# Patient Record
Sex: Female | Born: 1950
Health system: Southern US, Community
[De-identification: ages and names within clinical notes are randomized; demographics above are authoritative.]

## PROBLEM LIST (undated history)

## (undated) DIAGNOSIS — F32A Depression, unspecified: Secondary | ICD-10-CM

## (undated) DIAGNOSIS — E119 Type 2 diabetes mellitus without complications: Secondary | ICD-10-CM

## (undated) DIAGNOSIS — J189 Pneumonia, unspecified organism: Secondary | ICD-10-CM

## (undated) DIAGNOSIS — J45909 Unspecified asthma, uncomplicated: Secondary | ICD-10-CM

## (undated) DIAGNOSIS — K219 Gastro-esophageal reflux disease without esophagitis: Secondary | ICD-10-CM

## (undated) DIAGNOSIS — F329 Major depressive disorder, single episode, unspecified: Secondary | ICD-10-CM

## (undated) DIAGNOSIS — R270 Ataxia, unspecified: Secondary | ICD-10-CM

## (undated) DIAGNOSIS — D649 Anemia, unspecified: Secondary | ICD-10-CM

## (undated) DIAGNOSIS — M1990 Unspecified osteoarthritis, unspecified site: Secondary | ICD-10-CM

## (undated) DIAGNOSIS — C801 Malignant (primary) neoplasm, unspecified: Secondary | ICD-10-CM

## (undated) DIAGNOSIS — E559 Vitamin D deficiency, unspecified: Secondary | ICD-10-CM

## (undated) DIAGNOSIS — E785 Hyperlipidemia, unspecified: Secondary | ICD-10-CM

## (undated) HISTORY — PX: APPENDECTOMY: SHX54

## (undated) HISTORY — PX: ABDOMINAL HYSTERECTOMY: SHX81

## (undated) HISTORY — PX: COLONOSCOPY: SHX174

## (undated) HISTORY — PX: OTHER SURGICAL HISTORY: SHX169

## (undated) HISTORY — PX: TONSILLECTOMY: SUR1361

## (undated) HISTORY — PX: FOOT SURGERY: SHX648

## (undated) HISTORY — PX: UPPER GI ENDOSCOPY: SHX6162

## (undated) HISTORY — PX: MASTECTOMY: SHX3

---
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
## 1998-06-19 DIAGNOSIS — C801 Malignant (primary) neoplasm, unspecified: Secondary | ICD-10-CM

## 1998-06-19 HISTORY — DX: Malignant (primary) neoplasm, unspecified: C80.1

## 2018-03-06 ENCOUNTER — Other Ambulatory Visit: Payer: Self-pay | Admitting: Podiatry

## 2018-03-08 NOTE — Patient Instructions (Addendum)
Yvette Mcconnell  03/08/2018     @PREFPERIOPPHARMACY @   Your procedure is scheduled on  03/13/2018   Report to [facility] at  900   A.M.  Call this number if you have problems the morning of surgery:  [phone]   Remember:  Do not eat or drink after midnight.  You may drink clear liquids until  12 midnight 03/12/2018.  Clear liquids allowed are:                    Water, Juice (non-citric and without pulp), Carbonated beverages, Clear Tea, Black Coffee only, Plain Jell-O only, Gatorade and Plain Popsicles only    Take these medicines the morning of surgery with A SIP OF WATER  Mobic, prilosec. Decrease your Levemir dosage to 1/2 the amount the night before.    Do not wear jewelry, make-up or nail polish.  Do not wear lotions, powders, or perfumes, or deodorant.  Do not shave 48 hours prior to surgery.  Men may shave face and neck.  Do not bring valuables to the hospital.  [facility] is not responsible for any belongings or valuables.  Contacts, dentures or bridgework may not be worn into surgery.  Leave your suitcase in the car.  After surgery it may be brought to your room.  For patients admitted to the hospital, discharge time will be determined by your treatment team.  Patients discharged the day of surgery will not be allowed to drive home.   Name and phone number of your driver:   family Special instructions:  None  Please read over the following fact sheets that you were given. Anesthesia Post-op Instructions and Care and Recovery After Surgery       Metatarsal Osteotomy Metatarsal osteotomy is a surgical procedure to correct a toe dislocation or deformity. The surgery may also help to relieve foot pain. Your metatarsals are the five long bones that connect your toes to the rest of your foot. Osteotomy is a surgical cut into a bone to reshape and reposition the bone or joint. Tell a health care provider about:  Any allergies you have.  All  medicines you are taking, including vitamins, herbs, eye drops, creams, and over-the-counter medicines.  Any problems you or family members have had with anesthetic medicines.  Any blood disorders you have.  Any surgeries you have had.  Any medical conditions you have. What are the risks? Generally, this is a safe procedure. However, problems may occur, including:  Stiffness.  Pain.  Infection.  Bleeding.  Allergic reactions to medicines.  Nerve damage that causes numbness.  Failure of the osteotomy to heal.  A blood clot that forms in your leg and travels to your lungs (pulmonary embolism).  What happens before the procedure?  Your health care provider may order imaging tests of your foot.  Follow instructions from your health care provider about eating or drinking restrictions.  Ask your health care provider about: ? Changing or stopping your regular medicines. This is especially important if you are taking diabetes medicines or blood thinners. ? Taking medicines such as aspirin and ibuprofen. These medicines can thin your blood. Do not take these medicines before your procedure if your health care provider instructs you not to.  Plan to have someone take you home after the procedure.  Ask your health care provider how your surgical site will be marked or identified.  You may be given antibiotic  medicine to help prevent infection. What happens during the procedure?  To reduce your risk of infection: ? Your health care team will wash or sanitize their hands. ? Your skin will be washed with soap. ? Hair may be removed from the surgical area.  An IV tube will be started in one of your veins.  You will be given one or more of the following: ? A medicine to help you relax (sedative). ? A medicine to numb the area (local anesthetic). ? A medicine to make you fall asleep (general anesthetic). ? A medicine that is injected into your spine to numb the area below and  slightly above the injection site (spinal anesthetic). ? A medicine that is injected into an area of your body to numb everything below the injection site (regional anesthetic).  A surgical cut (incision) will be made on your foot over the metatarsal bone that will be treated.  A cut will be made in the bone to shorten or straighten the bone.  Metal pins, plates, or screws may be used to hold the bone in the right position.  The incision will be closed with stitches (sutures) or staples.  A bandage (dressing) will be placed around the front and bottom of your foot. The procedure may vary among health care providers and hospitals. What happens after the procedure?  Your blood pressure, heart rate, breathing rate, and blood oxygen level will be monitored often until the medicines you were given have worn off.  You may be given walking aids, such as: ? A custom-fitted hard-soled shoe that keeps weight on your heel. ? A walking boot. ? A splint. ? Crutches or a walker to help you walk without putting weight on your foot.  Do not drive for 24 hours if you received a sedative. This information is not intended to replace advice given to you by your health care provider. Make sure you discuss any questions you have with your health care provider. Document Released: 05/17/2015 Document Revised: 11/11/2015 Document Reviewed: 01/28/2015 Elsevier Interactive Patient Education  2018 [place].  Metatarsal Osteotomy, Care After Refer to this sheet in the next few weeks. These instructions provide you with information about caring for yourself after your procedure. Your health care provider may also give you more specific instructions. Your treatment has been planned according to current medical practices, but problems sometimes occur. Call your health care provider if you have any problems or questions after your procedure. What can I expect after the procedure? After the procedure, it is common  to have:  Soreness.  Pain.  Stiffness.  Swelling.  Follow these instructions at home: If you have a splint:  Wear the splint as told by your health care provider. Remove it only as told by your health care provider.  Loosen the splint if your toes tingle, become numb, or turn cold and blue.  Do not let your splint get wet if it is not waterproof.  Keep the splint clean. Bathing  Do not take baths, swim, or use a hot tub until your health care provider approves. Ask your health care provider if you can take showers. You may only be allowed to take sponge baths for bathing.  If your splint is not waterproof, cover it with a watertight plastic bag when you take a bath or a shower.  Keep the bandage (dressing) dry until your health care provider says it can be removed. Incision care  Follow instructions from your health care provider about  how to take care of your cut from surgery (incision). Make sure you: ? Wash your hands with soap and water before you change your bandage (dressing). If soap and water are not available, use hand sanitizer. ? Change your dressing as told by your health care provider. ? Leave stitches (sutures), skin glue, or adhesive strips in place. These skin closures may need to stay in place for 2 weeks or longer. If adhesive strip edges start to loosen and curl up, you may trim the loose edges. Do not remove adhesive strips completely unless your health care provider tells you to do that.  Check your incision area every day for signs of infection. Check for: ? More redness, swelling, or pain. ? More fluid or blood. ? Warmth. ? Pus or a bad smell. Managing pain, stiffness, and swelling   If directed, apply ice to the injured area. ? Put ice in a plastic bag. ? Place a towel between your skin and the bag. ? Leave the ice on for 20 minutes, 2-3 times a day.  Move your toes often to avoid stiffness and to lessen swelling.  Raise (elevate) the injured  area above the level of your heart while you are sitting or lying down. Driving  Do not drive or operate heavy machinery while taking prescription pain medicine.  Do not drive for 24 hours if you received a sedative.  Ask your health care provider when it is safe to drive if you have a dressing, splint, special shoe, or walking boot on your foot. General instructions  If you were given a splint, special shoe, or walking boot, wear it as told by your health care provider.  Return to your normal activities as told by your health care provider. Ask your health care provider what activities are safe for you.  Do not use the injured limb to support your body weight until your health care provider says that you can. Use crutches or a walker as told by your health care provider.  Do not use any tobacco products, such as cigarettes, chewing tobacco, and e-cigarettes. Tobacco can delay bone healing. If you need help quitting, ask your health care provider.  Take over-the-counter and prescription medicines only as told by your health care provider.  Keep all follow-up visits as told by your health care provider. This is important. Contact a health care provider if:  You have a fever.  Your dressing becomes wet, loose, or stained with blood or discharge.  You have pus or a bad smell coming from your incision or bandage.  Your foot becomes red, swollen, or tender.  You have pain or stiffness that does not get better or gets worse.  You have tingling or numbness in your foot that does not get better or gets worse. Get help right away if:  You develop a warm and tender swelling in your leg.  You have chest pain.  You have trouble breathing. This information is not intended to replace advice given to you by your health care provider. Make sure you discuss any questions you have with your health care provider. Document Released: 05/17/2015 Document Revised: 11/11/2015 Document Reviewed:  01/28/2015 Elsevier Interactive Patient Education  2018 [place] Anesthesia is a term that refers to techniques, procedures, and medicines that help a person stay safe and comfortable during a medical procedure. Monitored anesthesia care, or sedation, is one type of anesthesia. Your anesthesia specialist may recommend sedation if you will be having  a procedure that does not require you to be unconscious, such as:  Cataract surgery.  A dental procedure.  A biopsy.  A colonoscopy.  During the procedure, you may receive a medicine to help you relax (sedative). There are three levels of sedation:  Mild sedation. At this level, you may feel awake and relaxed. You will be able to follow directions.  Moderate sedation. At this level, you will be sleepy. You may not remember the procedure.  Deep sedation. At this level, you will be asleep. You will not remember the procedure.  The more medicine you are given, the deeper your level of sedation will be. Depending on how you respond to the procedure, the anesthesia specialist may change your level of sedation or the type of anesthesia to fit your needs. An anesthesia specialist will monitor you closely during the procedure. Let your health care provider know about:  Any allergies you have.  All medicines you are taking, including vitamins, herbs, eye drops, creams, and over-the-counter medicines.  Any use of steroids (by mouth or as a cream).  Any problems you or family members have had with sedatives and anesthetic medicines.  Any blood disorders you have.  Any surgeries you have had.  Any medical conditions you have, such as sleep apnea.  Whether you are pregnant or may be pregnant.  Any use of cigarettes, alcohol, or street drugs. What are the risks? Generally, this is a safe procedure. However, problems may occur, including:  Getting too much medicine (oversedation).  Nausea.  Allergic  reaction to medicines.  Trouble breathing. If this happens, a breathing tube may be used to help with breathing. It will be removed when you are awake and breathing on your own.  Heart trouble.  Lung trouble.  Before the procedure Staying hydrated Follow instructions from your health care provider about hydration, which may include:  Up to 2 hours before the procedure - you may continue to drink clear liquids, such as water, clear fruit juice, black coffee, and plain tea.  Eating and drinking restrictions Follow instructions from your health care provider about eating and drinking, which may include:  8 hours before the procedure - stop eating heavy meals or foods such as meat, fried foods, or fatty foods.  6 hours before the procedure - stop eating light meals or foods, such as toast or cereal.  6 hours before the procedure - stop drinking milk or drinks that contain milk.  2 hours before the procedure - stop drinking clear liquids.  Medicines Ask your health care provider about:  Changing or stopping your regular medicines. This is especially important if you are taking diabetes medicines or blood thinners.  Taking medicines such as aspirin and ibuprofen. These medicines can thin your blood. Do not take these medicines before your procedure if your health care provider instructs you not to.  Tests and exams  You will have a physical exam.  You may have blood tests done to show: ? How well your kidneys and liver are working. ? How well your blood can clot.  General instructions  Plan to have someone take you home from the hospital or clinic.  If you will be going home right after the procedure, plan to have someone with you for 24 hours.  What happens during the procedure?  Your blood pressure, heart rate, breathing, level of pain and overall condition will be monitored.  An IV tube will be inserted into one of your veins.  Your anesthesia  specialist will give you  medicines as needed to keep you comfortable during the procedure. This may mean changing the level of sedation.  The procedure will be performed. After the procedure  Your blood pressure, heart rate, breathing rate, and blood oxygen level will be monitored until the medicines you were given have worn off.  Do not drive for 24 hours if you received a sedative.  You may: ? Feel sleepy, clumsy, or nauseous. ? Feel forgetful about what happened after the procedure. ? Have a sore throat if you had a breathing tube during the procedure. ? Vomit. This information is not intended to replace advice given to you by your health care provider. Make sure you discuss any questions you have with your health care provider. Document Released: 03/01/2005 Document Revised: 11/12/2015 Document Reviewed: 09/26/2015 Elsevier Interactive Patient Education  2018 [place], Care After These instructions provide you with information about caring for yourself after your procedure. Your health care provider may also give you more specific instructions. Your treatment has been planned according to current medical practices, but problems sometimes occur. Call your health care provider if you have any problems or questions after your procedure. What can I expect after the procedure? After your procedure, it is common to:  Feel sleepy for several hours.  Feel clumsy and have poor balance for several hours.  Feel forgetful about what happened after the procedure.  Have poor judgment for several hours.  Feel nauseous or vomit.  Have a sore throat if you had a breathing tube during the procedure.  Follow these instructions at home: For at least 24 hours after the procedure:   Do not: ? Participate in activities in which you could fall or become injured. ? Drive. ? Use heavy machinery. ? Drink alcohol. ? Take sleeping pills or medicines that cause drowsiness. ? Make important  decisions or sign legal documents. ? Take care of children on your own.  Rest. Eating and drinking  Follow the diet that is recommended by your health care provider.  If you vomit, drink water, juice, or soup when you can drink without vomiting.  Make sure you have little or no nausea before eating solid foods. General instructions  Have a responsible adult stay with you until you are awake and alert.  Take over-the-counter and prescription medicines only as told by your health care provider.  If you smoke, do not smoke without supervision.  Keep all follow-up visits as told by your health care provider. This is important. Contact a health care provider if:  You keep feeling nauseous or you keep vomiting.  You feel light-headed.  You develop a rash.  You have a fever. Get help right away if:  You have trouble breathing. This information is not intended to replace advice given to you by your health care provider. Make sure you discuss any questions you have with your health care provider. Document Released: 09/26/2015 Document Revised: 01/26/2016 Document Reviewed: 09/26/2015 Elsevier Interactive Patient Education  [employer].

## 2018-03-11 ENCOUNTER — Other Ambulatory Visit (HOSPITAL_COMMUNITY): Payer: Self-pay | Admitting: Podiatry

## 2018-03-11 ENCOUNTER — Encounter (HOSPITAL_COMMUNITY): Payer: Self-pay

## 2018-03-11 ENCOUNTER — Other Ambulatory Visit: Payer: Self-pay

## 2018-03-11 ENCOUNTER — Ambulatory Visit (HOSPITAL_COMMUNITY)
Admission: RE | Admit: 2018-03-11 | Discharge: 2018-03-11 | Disposition: A | Discharge status: Home / Self Care | Payer: Medicare Other | Source: Ambulatory Visit | Attending: Podiatry | Admitting: Podiatry

## 2018-03-11 ENCOUNTER — Encounter (HOSPITAL_COMMUNITY)
Admission: RE | Admit: 2018-03-11 | Discharge: 2018-03-11 | Disposition: A | Discharge status: Home / Self Care | Payer: Medicare Other | Source: Ambulatory Visit | Attending: Podiatry | Admitting: Podiatry

## 2018-03-11 DIAGNOSIS — E119 Type 2 diabetes mellitus without complications: Secondary | ICD-10-CM | POA: Insufficient documentation

## 2018-03-11 DIAGNOSIS — K219 Gastro-esophageal reflux disease without esophagitis: Secondary | ICD-10-CM | POA: Insufficient documentation

## 2018-03-11 DIAGNOSIS — M869 Osteomyelitis, unspecified: Secondary | ICD-10-CM | POA: Diagnosis not present

## 2018-03-11 DIAGNOSIS — M19072 Primary osteoarthritis, left ankle and foot: Secondary | ICD-10-CM | POA: Insufficient documentation

## 2018-03-11 DIAGNOSIS — Z01818 Encounter for other preprocedural examination: Secondary | ICD-10-CM | POA: Insufficient documentation

## 2018-03-11 HISTORY — DX: Type 2 diabetes mellitus without complications: E11.9

## 2018-03-11 HISTORY — DX: Unspecified osteoarthritis, unspecified site: M19.90

## 2018-03-11 HISTORY — DX: Gastro-esophageal reflux disease without esophagitis: K21.9

## 2018-03-11 LAB — CBC WITH DIFFERENTIAL/PLATELET
BASOS PCT: 0 %
Basophils Absolute: 0 10*3/uL (ref 0.0–0.1)
Eosinophils Absolute: 0.4 10*3/uL (ref 0.0–0.7)
Eosinophils Relative: 5 %
HEMATOCRIT: 34.9 % — AB (ref 36.0–46.0)
HEMOGLOBIN: 11 g/dL — AB (ref 12.0–15.0)
Lymphocytes Relative: 23 %
Lymphs Abs: 1.8 10*3/uL (ref 0.7–4.0)
MCH: 26.3 pg (ref 26.0–34.0)
MCHC: 31.5 g/dL (ref 30.0–36.0)
MCV: 83.3 fL (ref 78.0–100.0)
MONOS PCT: 7 %
Monocytes Absolute: 0.6 10*3/uL (ref 0.1–1.0)
NEUTROS ABS: 5.1 10*3/uL (ref 1.7–7.7)
NEUTROS PCT: 64 %
Platelets: 430 10*3/uL — ABNORMAL HIGH (ref 150–400)
RBC: 4.19 MIL/uL (ref 3.87–5.11)
RDW: 15.3 % (ref 11.5–15.5)
WBC: 8 10*3/uL (ref 4.0–10.5)

## 2018-03-11 LAB — BASIC METABOLIC PANEL
ANION GAP: 12 (ref 5–15)
BUN: 30 mg/dL — ABNORMAL HIGH (ref 8–23)
CHLORIDE: 100 mmol/L (ref 98–111)
CO2: 24 mmol/L (ref 22–32)
CREATININE: 1.06 mg/dL — AB (ref 0.44–1.00)
Calcium: 9.1 mg/dL (ref 8.9–10.3)
GFR calc Af Amer: >60 mL/min (ref >60)
GFR calc non Af Amer: 53 mL/min — ABNORMAL LOW (ref >60)
Glucose, Bld: 284 mg/dL — ABNORMAL HIGH (ref 70–99)
POTASSIUM: 4.6 mmol/L (ref 3.5–5.1)
Sodium: 136 mmol/L (ref 135–145)

## 2018-03-11 LAB — GLUCOSE, CAPILLARY: GLUCOSE-CAPILLARY: 272 mg/dL — AB (ref 70–99)

## 2018-03-11 LAB — HEMOGLOBIN A1C
Hgb A1c MFr Bld: 8.2 % — ABNORMAL HIGH (ref 4.8–5.6)
MEAN PLASMA GLUCOSE: 188.64 mg/dL

## 2018-03-13 ENCOUNTER — Ambulatory Visit (HOSPITAL_COMMUNITY): Payer: Medicare Other | Admitting: Anesthesiology

## 2018-03-13 ENCOUNTER — Ambulatory Visit (HOSPITAL_COMMUNITY)
Admission: RE | Admit: 2018-03-13 | Discharge: 2018-03-13 | Disposition: A | Discharge status: Home / Self Care | Payer: Medicare Other | Source: Ambulatory Visit | Attending: Podiatry | Admitting: Podiatry

## 2018-03-13 ENCOUNTER — Encounter (HOSPITAL_COMMUNITY)
Admission: RE | Disposition: A | Discharge status: Home / Self Care | Payer: Self-pay | Source: Ambulatory Visit | Attending: Podiatry

## 2018-03-13 ENCOUNTER — Ambulatory Visit (HOSPITAL_COMMUNITY): Payer: Medicare Other

## 2018-03-13 ENCOUNTER — Encounter (HOSPITAL_COMMUNITY): Payer: Self-pay | Admitting: *Deleted

## 2018-03-13 DIAGNOSIS — E11621 Type 2 diabetes mellitus with foot ulcer: Secondary | ICD-10-CM | POA: Diagnosis not present

## 2018-03-13 DIAGNOSIS — Z9889 Other specified postprocedural states: Secondary | ICD-10-CM

## 2018-03-13 DIAGNOSIS — Z79899 Other long term (current) drug therapy: Secondary | ICD-10-CM | POA: Diagnosis not present

## 2018-03-13 DIAGNOSIS — Z794 Long term (current) use of insulin: Secondary | ICD-10-CM | POA: Insufficient documentation

## 2018-03-13 DIAGNOSIS — E1140 Type 2 diabetes mellitus with diabetic neuropathy, unspecified: Secondary | ICD-10-CM | POA: Insufficient documentation

## 2018-03-13 DIAGNOSIS — K219 Gastro-esophageal reflux disease without esophagitis: Secondary | ICD-10-CM | POA: Diagnosis not present

## 2018-03-13 DIAGNOSIS — L02612 Cutaneous abscess of left foot: Secondary | ICD-10-CM | POA: Insufficient documentation

## 2018-03-13 DIAGNOSIS — L97529 Non-pressure chronic ulcer of other part of left foot with unspecified severity: Secondary | ICD-10-CM | POA: Insufficient documentation

## 2018-03-13 HISTORY — PX: INCISION AND DRAINAGE: SHX5863

## 2018-03-13 HISTORY — PX: BONE BIOPSY: SHX375

## 2018-03-13 HISTORY — PX: SESAMOIDECTOMY: SHX6418

## 2018-03-13 LAB — GLUCOSE, CAPILLARY
GLUCOSE-CAPILLARY: 145 mg/dL — AB (ref 70–99)
Glucose-Capillary: 161 mg/dL — ABNORMAL HIGH (ref 70–99)

## 2018-03-13 SURGERY — INCISION AND DRAINAGE
Anesthesia: Monitor Anesthesia Care | Laterality: Left

## 2018-03-13 MED ORDER — MIDAZOLAM HCL 5 MG/5ML IJ SOLN
INTRAMUSCULAR | Status: DC | PRN
  Administered 2018-03-13: 2 mg via INTRAVENOUS

## 2018-03-13 MED ORDER — VANCOMYCIN HCL 1000 MG IV SOLR
1000.0000 mg | Freq: Once | INTRAVENOUS | Status: DC
  Administered 2018-03-13: 1000 mg via INTRAVENOUS
  Filled 2018-03-13: qty 1000

## 2018-03-13 MED ORDER — PROPOFOL 10 MG/ML IV BOLUS
INTRAVENOUS | Status: AC
  Filled 2018-03-13: qty 20

## 2018-03-13 MED ORDER — FENTANYL CITRATE (PF) 100 MCG/2ML IJ SOLN
INTRAMUSCULAR | Status: AC
  Filled 2018-03-13: qty 2

## 2018-03-13 MED ORDER — PROPOFOL 500 MG/50ML IV EMUL
INTRAVENOUS | Status: DC | PRN
  Administered 2018-03-13: 11:00:00 via INTRAVENOUS
  Administered 2018-03-13: 125 ug/kg/min via INTRAVENOUS

## 2018-03-13 MED ORDER — SODIUM CHLORIDE 0.9 % IV SOLN
INTRAVENOUS | Status: DC | PRN
  Administered 2018-03-13: 10:00:00 via INTRAVENOUS

## 2018-03-13 MED ORDER — PROMETHAZINE HCL 25 MG/ML IJ SOLN
6.2500 mg | INTRAMUSCULAR | Status: DC | PRN
  Administered 2018-03-13: 6.25 mg via INTRAVENOUS
  Filled 2018-03-13: qty 1

## 2018-03-13 MED ORDER — BUPIVACAINE HCL (PF) 0.5 % IJ SOLN
INTRAMUSCULAR | Status: AC
  Filled 2018-03-13: qty 30

## 2018-03-13 MED ORDER — PROPOFOL 10 MG/ML IV BOLUS
INTRAVENOUS | Status: DC | PRN
  Administered 2018-03-13: 20 mg via INTRAVENOUS
  Administered 2018-03-13: 60 mg via INTRAVENOUS

## 2018-03-13 MED ORDER — LIDOCAINE HCL (PF) 1 % IJ SOLN: INTRAMUSCULAR | Status: DC | PRN

## 2018-03-13 MED ORDER — FENTANYL CITRATE (PF) 250 MCG/5ML IJ SOLN
INTRAMUSCULAR | Status: DC | PRN
  Administered 2018-03-13 (×4): 25 ug via INTRAVENOUS

## 2018-03-13 MED ORDER — SODIUM CHLORIDE 0.9% FLUSH
INTRAVENOUS | Status: AC
  Filled 2018-03-13: qty 10

## 2018-03-13 MED ORDER — LIDOCAINE HCL 1 % IJ SOLN
INTRAMUSCULAR | Status: DC | PRN
  Administered 2018-03-13: 20 mL via INTRAMUSCULAR

## 2018-03-13 MED ORDER — MEPERIDINE HCL 50 MG/ML IJ SOLN: 6.2500 mg | INTRAMUSCULAR | Status: DC | PRN

## 2018-03-13 MED ORDER — LACTATED RINGERS IV SOLN
INTRAVENOUS | Status: DC
  Administered 2018-03-13: 13:00:00 via INTRAVENOUS

## 2018-03-13 MED ORDER — BUPIVACAINE HCL (PF) 0.5 % IJ SOLN
INTRAMUSCULAR | Status: DC | PRN
  Administered 2018-03-13: 10 mL

## 2018-03-13 MED ORDER — LACTATED RINGERS IV SOLN
INTRAVENOUS | Status: DC
  Administered 2018-03-13: 10:00:00 via INTRAVENOUS

## 2018-03-13 MED ORDER — LIDOCAINE HCL (PF) 1 % IJ SOLN
INTRAMUSCULAR | Status: AC
  Filled 2018-03-13: qty 30

## 2018-03-13 MED ORDER — VANCOMYCIN HCL IN DEXTROSE 1-5 GM/200ML-% IV SOLN: 1000.0000 mg | Freq: Once | INTRAVENOUS | Status: DC

## 2018-03-13 MED ORDER — MIDAZOLAM HCL 2 MG/2ML IJ SOLN
INTRAMUSCULAR | Status: AC
  Filled 2018-03-13: qty 2

## 2018-03-13 MED ORDER — SODIUM CHLORIDE 0.9 % IR SOLN
Status: DC | PRN
  Administered 2018-03-13: 1

## 2018-03-13 SURGICAL SUPPLY — 57 items
BANDAGE ACE 4 STERILE (GAUZE/BANDAGES/DRESSINGS) ×2 IMPLANT
BANDAGE CONFORM 2X5YD N/S (GAUZE/BANDAGES/DRESSINGS) ×2 IMPLANT
BANDAGE ELASTIC 4 LF NS (GAUZE/BANDAGES/DRESSINGS) ×2 IMPLANT
BANDAGE ESMARK 4X12 BL STRL LF (DISPOSABLE) IMPLANT
BENZOIN TINCTURE PRP APPL 2/3 (GAUZE/BANDAGES/DRESSINGS) ×2 IMPLANT
BLADE AVERAGE 25X9 (BLADE) IMPLANT
BLADE SURG 15 STRL LF DISP TIS (BLADE) ×2 IMPLANT
BLADE SURG 15 STRL SS (BLADE) ×2
BNDG CONFORM 2 STRL LF (GAUZE/BANDAGES/DRESSINGS) ×2 IMPLANT
BNDG ESMARK 4X12 BLUE STRL LF (DISPOSABLE)
BNDG GAUZE ELAST 4 BULKY (GAUZE/BANDAGES/DRESSINGS) ×2 IMPLANT
BOOT STEPPER DURA LG (SOFTGOODS) IMPLANT
BOOT STEPPER DURA MED (SOFTGOODS) IMPLANT
BOOT STEPPER DURA SM (SOFTGOODS) IMPLANT
BOOT STEPPER DURA XLG (SOFTGOODS) IMPLANT
CHLORAPREP W/TINT 26ML (MISCELLANEOUS) ×2 IMPLANT
CLOTH BEACON ORANGE TIMEOUT ST (SAFETY) ×2 IMPLANT
CLSR STERI-STRIP ANTIMIC 1/2X4 (GAUZE/BANDAGES/DRESSINGS) ×2 IMPLANT
COVER LIGHT HANDLE STERIS (MISCELLANEOUS) ×4 IMPLANT
CUFF TOURNIQUET SINGLE 18IN (TOURNIQUET CUFF) ×2 IMPLANT
DECANTER SPIKE VIAL GLASS SM (MISCELLANEOUS) ×4 IMPLANT
DRAPE OEC MINIVIEW 54X84 (DRAPES) IMPLANT
DRSG ADAPTIC 3X8 NADH LF (GAUZE/BANDAGES/DRESSINGS) ×2 IMPLANT
ELECT REM PT RETURN 9FT ADLT (ELECTROSURGICAL) ×2
ELECTRODE REM PT RTRN 9FT ADLT (ELECTROSURGICAL) ×1 IMPLANT
GAUZE PACKING IODOFORM 1/4X15 (GAUZE/BANDAGES/DRESSINGS) ×2 IMPLANT
GAUZE SPONGE 4X4 12PLY STRL (GAUZE/BANDAGES/DRESSINGS) ×2 IMPLANT
GLOVE BIO SURGEON STRL SZ7 (GLOVE) ×2 IMPLANT
GLOVE BIO SURGEON STRL SZ7.5 (GLOVE) ×2 IMPLANT
GLOVE BIOGEL PI IND STRL 7.0 (GLOVE) ×2 IMPLANT
GLOVE BIOGEL PI IND STRL 7.5 (GLOVE) ×1 IMPLANT
GLOVE BIOGEL PI INDICATOR 7.0 (GLOVE) ×2
GLOVE BIOGEL PI INDICATOR 7.5 (GLOVE) ×1
GLOVE ECLIPSE 7.0 STRL STRAW (GLOVE) ×2 IMPLANT
GOWN STRL REUS W/ TWL LRG LVL3 (GOWN DISPOSABLE) ×1 IMPLANT
GOWN STRL REUS W/TWL LRG LVL3 (GOWN DISPOSABLE) ×7 IMPLANT
KIT TURNOVER CYSTO (KITS) ×2 IMPLANT
KIT TURNOVER KIT A (KITS) ×2 IMPLANT
MANIFOLD NEPTUNE II (INSTRUMENTS) ×2 IMPLANT
MARKER SKIN DUAL TIP RULER LAB (MISCELLANEOUS) IMPLANT
NEEDLE HYPO 25X1 1.5 SAFETY (NEEDLE) ×6 IMPLANT
NS IRRIG 1000ML POUR BTL (IV SOLUTION) ×2 IMPLANT
PACK BASIC LIMB (CUSTOM PROCEDURE TRAY) ×2 IMPLANT
PAD ABD 5X9 TENDERSORB (GAUZE/BANDAGES/DRESSINGS) ×2 IMPLANT
PAD ARMBOARD 7.5X6 YLW CONV (MISCELLANEOUS) ×2 IMPLANT
RASP SM TEAR CROSS CUT (RASP) IMPLANT
SET BASIN LINEN APH (SET/KITS/TRAYS/PACK) ×2 IMPLANT
SPONGE GAUZE 4X4 16PLY NONSTR (GAUZE/BANDAGES/DRESSINGS) ×2 IMPLANT
SPONGE LAP 18X18 X RAY DECT (DISPOSABLE) ×2 IMPLANT
SUT ETHILON 3 0 FSL (SUTURE) ×2 IMPLANT
SUT MON AB 2-0 CT1 36 (SUTURE) IMPLANT
SUT PROLENE 4 0 PS 2 18 (SUTURE) IMPLANT
SUT VIC AB 2-0 CT2 27 (SUTURE) ×2 IMPLANT
SUT VIC AB 4-0 PS2 27 (SUTURE) IMPLANT
SUT VICRYL AB 3-0 FS1 BRD 27IN (SUTURE) ×2 IMPLANT
SYR 30ML LL (SYRINGE) ×2 IMPLANT
SYR CONTROL 10ML LL (SYRINGE) ×4 IMPLANT

## 2018-03-13 NOTE — H&P (Signed)
.   HISTORY AND PHYSICAL INTERVAL NOTE:  03/13/2018  9:46 AM  Yvette Mcconnell  has presented today for surgery, with the diagnosis of left foot infected ulcer, evaluate for osteomyelitis.  The various methods of treatment have been discussed with the patient.  No guarantees were given.  After consideration of risks, benefits and other options for treatment, the patient has consented to surgery.  I have reviewed the patients' chart and labs.    Patient Vitals for the past 24 hrs:  Temp Temp src Pulse Resp SpO2  03/13/18 0852 98.2 F (36.8 C) Oral 87 16 97 %    A history and physical examination was performed in my office.  The patient was reexamined.  There have been no changes to this history and physical examination.  Yvette Mcconnell, DPM

## 2018-03-13 NOTE — Brief Op Note (Signed)
03/13/2018  12:33 PM  PATIENT:  Yvette Mcconnell  67 y.o. female  PRE-OPERATIVE DIAGNOSIS:  left foot infected ulcer, evaluate for osteomyelitis  POST-OPERATIVE DIAGNOSIS:  left foot infected ulcer, evaluate for osteomyelitis  PROCEDURE:  Procedure(s): INCISION AND DRAINAGE LEFT FOOT ULCER (Left) TIBIAL AND FIBULAR SESAMOIDECTOMY LEFT FOOT (Left) BONE BIOPSY 1ST METATARSAL HEAD LEFT FOOT (Left)  SURGEON:  Surgeon(s) and Role:    * Posey Pronto, Layla Barter, DPM - Primary  PHYSICIAN ASSISTANT:   ASSISTANTS: none   ANESTHESIA:   local and MAC  EBL:  5 mL   BLOOD ADMINISTERED:none  DRAINS: none   LOCAL MEDICATIONS USED:  MARCAINE   , LIDOCAINE  and Amount: 20 ml  SPECIMEN:  Excision  DISPOSITION OF SPECIMEN:  PATHOLOGY  COUNTS:  YES  TOURNIQUET:   Total Tourniquet Time Documented: Calf (Left) - 108 minutes Total: Calf (Left) - 108 minutes   DICTATION: .Viviann Spare Dictation  PLAN OF CARE: Discharge to home after PACU  PATIENT DISPOSITION:  PACU - hemodynamically stable.   Delay start of Pharmacological VTE agent (>24hrs) due to surgical blood loss or risk of bleeding: no.

## 2018-03-13 NOTE — Discharge Instructions (Signed)
.These instructions will give you an idea of what to expect after surgery and how to manage issues that may arise before your first post op office visit.  Pain Management Pain is best managed by staying ahead of it. If pain gets out of control, it is difficult to get it back under control. Local anesthesia that lasts 6-8 hours is used to numb the foot and decrease pain.  For the best pain control, take the pain medication every 4 hours for the first 2 days post op. On the third day pain medication can be taken as needed.   Post Op Nausea Nausea is common after surgery, so it is managed proactively.  If prescribed, use the prescribed nausea medication regularly for the first 2 days post op.  Bandages Do not worry if there is blood on the bandage. What looks like a lot of blood on the bandage is actually a small amount. Blood on the dressing spreads out as it is absorbed by the gauze, the same way a drop of water spreads out on a paper towel.  If the bandages feel wet or dry, stiff and uncomfortable, call the office during office hours and we will schedule a time for you to have the bandage changed.  Unless you are specifically told otherwise, we will do the first bandage change in the office.  Keep your bandage dry. If the bandage becomes wet or soiled, notify the office and we will schedule a time to change the bandage.  Activity It is best to spend most of the first 2 days after surgery lying down with the foot elevated above the level of your heart. You may put weight on your heel while wearing the surgical shoe.   You may only get up to go to the restroom.  Driving Do not drive until you are able to respond in an emergency (i.e. slam on the brakes). This usually occurs after the bone has healed - 6 to 8 weeks.  Call the Office If you have a fever over 101F.  If you have increasing pain after the initial post op pain has settled down.  If you have increasing redness, swelling, or  drainage.  If you have any questions or concerns.   General Anesthesia, Adult, Care After These instructions provide you with information about caring for yourself after your procedure. Your health care provider may also give you more specific instructions. Your treatment has been planned according to current medical practices, but problems sometimes occur. Call your health care provider if you have any problems or questions after your procedure. What can I expect after the procedure? After the procedure, it is common to have:  Vomiting.  A sore throat.  Mental slowness.  It is common to feel:  Nauseous.  Cold or shivery.  Sleepy.  Tired.  Sore or achy, even in parts of your body where you did not have surgery.  Follow these instructions at home: For at least 24 hours after the procedure:  Do not: ? Participate in activities where you could fall or become injured. ? Drive. ? Use heavy machinery. ? Drink alcohol. ? Take sleeping pills or medicines that cause drowsiness. ? Make important decisions or sign legal documents. ? Take care of children on your own.  Rest. Eating and drinking  If you vomit, drink water, juice, or soup when you can drink without vomiting.  Drink enough fluid to keep your urine clear or pale yellow.  Make sure you have little  or no nausea before eating solid foods.  Follow the diet recommended by your health care provider. General instructions  Have a responsible adult stay with you until you are awake and alert.  Return to your normal activities as told by your health care provider. Ask your health care provider what activities are safe for you.  Take over-the-counter and prescription medicines only as told by your health care provider.  If you smoke, do not smoke without supervision.  Keep all follow-up visits as told by your health care provider. This is important. Contact a health care provider if:  You continue to have nausea or  vomiting at home, and medicines are not helpful.  You cannot drink fluids or start eating again.  You cannot urinate after 8-12 hours.  You develop a skin rash.  You have fever.  You have increasing redness at the site of your procedure. Get help right away if:  You have difficulty breathing.  You have chest pain.  You have unexpected bleeding.  You feel that you are having a life-threatening or urgent problem. This information is not intended to replace advice given to you by your health care provider. Make sure you discuss any questions you have with your health care provider. Document Released: 09/11/2000 Document Revised: 11/08/2015 Document Reviewed: 05/20/2015 Elsevier Interactive Patient Education  [employer].

## 2018-03-13 NOTE — Anesthesia Postprocedure Evaluation (Signed)
Anesthesia Post Note  Patient: Yvette Mcconnell  Procedure(s) Performed: INCISION AND DRAINAGE LEFT FOOT ULCER (Left ) TIBIAL AND FIBULAR SESAMOIDECTOMY LEFT FOOT (Left ) BONE BIOPSY 1ST METATARSAL HEAD LEFT FOOT (Left )  Patient location during evaluation: PACU Anesthesia Type: MAC Level of consciousness: awake and alert and patient cooperative Pain management: pain level controlled Vital Signs Assessment: post-procedure vital signs reviewed and stable Respiratory status: spontaneous breathing, nonlabored ventilation and respiratory function stable Cardiovascular status: blood pressure returned to baseline Postop Assessment: no apparent nausea or vomiting Anesthetic complications: no     Last Vitals:  Vitals:   03/13/18 0852 03/13/18 1235  Pulse: 87   Resp: 16   Temp: 36.8 C 37 C  SpO2: 97% 100%    Last Pain:  Vitals:   03/13/18 0852  TempSrc: Oral  PainSc: 0-No pain                 Mackensie Pilson J

## 2018-03-13 NOTE — Anesthesia Preprocedure Evaluation (Signed)
Anesthesia Evaluation  Patient identified by MRN, date of birth, ID band Patient awake    Reviewed: Allergy & Precautions, H&P , NPO status , Patient's Chart, lab work & pertinent test results, reviewed documented beta blocker date and time   Airway Mallampati: II  TM Distance: >3 FB Neck ROM: full    Dental no notable dental hx. (+) Teeth Intact, Dental Advidsory Given   Pulmonary neg pulmonary ROS,    Pulmonary exam normal breath sounds clear to auscultation       Cardiovascular Exercise Tolerance: Good negative cardio ROS   Rhythm:regular Rate:Normal     Neuro/Psych negative neurological ROS  negative psych ROS   GI/Hepatic negative GI ROS, Neg liver ROS, GERD  ,  Endo/Other  negative endocrine ROSdiabetes, Type 2  Renal/GU negative Renal ROS  negative genitourinary   Musculoskeletal   Abdominal   Peds  Hematology negative hematology ROS (+)   Anesthesia Other Findings NSR 85 Denies any cardiopulmonary hx  Reproductive/Obstetrics negative OB ROS                             Anesthesia Physical Anesthesia Plan  ASA: III  Anesthesia Plan: MAC   Post-op Pain Management:    Induction:   PONV Risk Score and Plan:   Airway Management Planned:   Additional Equipment:   Intra-op Plan:   Post-operative Plan:   Informed Consent: I have reviewed the patients History and Physical, chart, labs and discussed the procedure including the risks, benefits and alternatives for the proposed anesthesia with the patient or authorized representative who has indicated his/her understanding and acceptance.   Dental Advisory Given  Plan Discussed with: CRNA and Anesthesiologist  Anesthesia Plan Comments:         Anesthesia Quick Evaluation

## 2018-03-13 NOTE — Op Note (Signed)
03/13/2018  12:33 PM  PATIENT:  Yvette Mcconnell  67 y.o. female  PRE-OPERATIVE DIAGNOSIS:  left foot infected ulcer, evaluate for osteomyelitis  POST-OPERATIVE DIAGNOSIS:  left foot infected ulcer, evaluate for osteomyelitis  PROCEDURE:  Procedure(s): INCISION AND DRAINAGE LEFT FOOT ULCER (Left) TIBIAL AND FIBULAR SESAMOIDECTOMY LEFT FOOT (Left) BONE BIOPSY 1ST METATARSAL HEAD LEFT FOOT (Left)  SURGEON:  Surgeon(s) and Role:    * Roshanna Cimino, Layla Barter, DPM - Primary  PHYSICIAN ASSISTANT:   ASSISTANTS: none   ANESTHESIA:   local and MAC  EBL:  5 mL   BLOOD ADMINISTERED:none  DRAINS: none   LOCAL MEDICATIONS USED:  MARCAINE   , LIDOCAINE  and Amount: 20 ml  SPECIMEN:  Excision  DISPOSITION OF SPECIMEN:  PATHOLOGY  COUNTS:  YES  TOURNIQUET:   Total Tourniquet Time Documented: Calf (Left) - 108 minutes Total: Calf (Left) - 108 minutes  Patient was brought into the operating room laid supine on the operating table. Ankle tourniquet was applied to the surgical extremity. Following IV sedation, a local block was achieved using 20 cc of mixture of 1% plain lidocaine with 0.5% marcaine. The foot was the prepped, scrubbed and draped in aseptic manner. The left lower extremity was held up for 3 minute and the tourniquet on the surgical site was inflatted at 272mHG.   Attention was directed towards the left first metatarsal. There is plantar ulceration noted at sub 1st met head with 0.2x0.2x0.5. There is serosanguinous drainage noted. Localized redness noted with soft tissue swelling and tenderness.    Medial plantar incision was planned to remove the tibial and fibular sesamoids. After the skin incision, neurovascular bundle was protected. There was lot of scar tissue noted from previous surgery and thickening of the capsule. Capsular incision was made and tibial sesamoid was visualized. After careful dissection the tibial sesamoid was removed. The flexor hallucis longus was intact.  Care was made to retract the tendon. At this time the fibular sesamoid was visualized and isolated. Using sharp and blunt dissection the fibular sesamoid was removed. It was broke into fragments, but all the bony fragments were removed. Fluoroscopy was used to confirm. At this the dorsal capsule was reflacted and using bone rongeur few piece of bone from 1st metatarsal head was removed for pathology. It was noted that range of motion of the 1st MPJ was increased slightly after clearing the fragments from the metatarsal head. Wound was flushed with copious amount of saline.   At this the attention was directed towards the plantar ulceration. From the ulcer site, a stab incision was made to extend the ulcer atleast 1cm. Using a straight hemostat, tissue dissection was performed. No purulent drainage noted. Deep wound cultures were obtained. Wound was flushed  with normal saline.   At this time the the capsule was closed using 2-0 Vicryl, sub cutaneous layer using 3-0 Vicryl and skin was closed using 3-0 Nylon. 1/2 inch Iodoform packing was used to pack the ulcer site on the plantar aspect. Steri strips and betadine adaptic was applied. Dry sterile dressing was applied.   Bone pieces will be sent to pathology for evaluation. Patient will be in surgical shoe and using knee scooter.   Patient has post op appointment with me in 2 days.

## 2018-03-13 NOTE — Transfer of Care (Signed)
Immediate Anesthesia Transfer of Care Note  Patient: Noora Locascio Folta  Procedure(s) Performed: INCISION AND DRAINAGE LEFT FOOT ULCER (Left ) TIBIAL AND FIBULAR SESAMOIDECTOMY LEFT FOOT (Left ) BONE BIOPSY 1ST METATARSAL HEAD LEFT FOOT (Left )  Patient Location: PACU  Anesthesia Type:General  Level of Consciousness: awake and patient cooperative  Airway & Oxygen Therapy: Patient Spontanous Breathing and Patient connected to face mask oxygen  Post-op Assessment: Report given to RN, Post -op Vital signs reviewed and stable and Patient moving all extremities  Post vital signs: Reviewed and stable  Last Vitals:  Vitals Value Taken Time  BP    Temp    Pulse    Resp    SpO2      Last Pain:  Vitals:   03/13/18 0852  TempSrc: Oral  PainSc: 0-No pain      Patients Stated Pain Goal: 5 (22/97/98 9211)  Complications: No apparent anesthesia complications

## 2018-03-13 NOTE — Anesthesia Procedure Notes (Signed)
Procedure Name: LMA Insertion Date/Time: 03/13/2018 10:36 AM Performed by: Charmaine Downs, CRNA Pre-anesthesia Checklist: Emergency Drugs available, Patient identified, Suction available and Patient being monitored Patient Re-evaluated:Patient Re-evaluated prior to induction Oxygen Delivery Method: Circle system utilized Preoxygenation: Pre-oxygenation with 100% oxygen Ventilation: Mask ventilation without difficulty LMA: LMA inserted LMA Size: 3.0 Grade View: Grade II Number of attempts: 1 Placement Confirmation: positive ETCO2 and CO2 detector Tube secured with: Tape Dental Injury: Teeth and Oropharynx as per pre-operative assessment

## 2018-03-14 ENCOUNTER — Encounter (HOSPITAL_COMMUNITY): Payer: Self-pay | Admitting: Podiatry

## 2018-03-18 NOTE — Progress Notes (Signed)
Late entry. 03-18-2018, 10:54 am. Call to patient. A black and white polka dot glasses case found in a wheelchair. Patient notified. Patient request to "throw it away, I don't need it and I don't live there".  Verified with Delphia Grates RN via telephone with patient verbalizing to Delphia Grates RN that she "doesn't want the glass case".

## 2018-03-19 LAB — AEROBIC/ANAEROBIC CULTURE W GRAM STAIN (SURGICAL/DEEP WOUND)

## 2018-03-19 LAB — AEROBIC/ANAEROBIC CULTURE (SURGICAL/DEEP WOUND)

## 2019-04-07 ENCOUNTER — Encounter (HOSPITAL_COMMUNITY): Payer: Self-pay | Admitting: *Deleted

## 2019-04-07 ENCOUNTER — Other Ambulatory Visit (HOSPITAL_COMMUNITY)
Admission: RE | Admit: 2019-04-07 | Discharge: 2019-04-07 | Disposition: A | Discharge status: Home / Self Care | Payer: Medicare PPO | Source: Ambulatory Visit | Attending: Orthopedic Surgery | Admitting: Orthopedic Surgery

## 2019-04-07 ENCOUNTER — Other Ambulatory Visit: Payer: Self-pay

## 2019-04-07 DIAGNOSIS — Z01812 Encounter for preprocedural laboratory examination: Secondary | ICD-10-CM | POA: Diagnosis present

## 2019-04-07 DIAGNOSIS — Z20828 Contact with and (suspected) exposure to other viral communicable diseases: Secondary | ICD-10-CM | POA: Insufficient documentation

## 2019-04-07 LAB — SARS CORONAVIRUS 2 (TAT 6-24 HRS): SARS Coronavirus 2: NEGATIVE

## 2019-04-07 NOTE — Anesthesia Preprocedure Evaluation (Signed)
Anesthesia Evaluation  Patient identified by MRN, date of birth, ID band Patient awake    Reviewed: Allergy & Precautions, H&P , NPO status , Patient's Chart, lab work & pertinent test results, reviewed documented beta blocker date and time   Airway Mallampati: II  TM Distance: >3 FB Neck ROM: full    Dental no notable dental hx. (+) Teeth Intact, Dental Advidsory Given   Pulmonary asthma , pneumonia,    Pulmonary exam normal breath sounds clear to auscultation       Cardiovascular Exercise Tolerance: Good negative cardio ROS   Rhythm:regular Rate:Normal     Neuro/Psych PSYCHIATRIC DISORDERS Depression negative neurological ROS     GI/Hepatic negative GI ROS, Neg liver ROS, GERD  ,  Endo/Other  negative endocrine ROSdiabetes, Type 2  Renal/GU negative Renal ROS  negative genitourinary   Musculoskeletal  (+) Arthritis ,   Abdominal   Peds  Hematology negative hematology ROS (+) anemia ,   Anesthesia Other Findings NSR 85 Denies any cardiopulmonary hx  Reproductive/Obstetrics negative OB ROS                             Anesthesia Physical  Anesthesia Plan  ASA: III  Anesthesia Plan: General   Post-op Pain Management: GA combined w/ Regional for post-op pain   Induction: Intravenous  PONV Risk Score and Plan: 3 and Ondansetron, Dexamethasone and Treatment may vary due to age or medical condition  Airway Management Planned: Oral ETT  Additional Equipment:   Intra-op Plan:   Post-operative Plan: Extubation in OR  Informed Consent: I have reviewed the patients History and Physical, chart, labs and discussed the procedure including the risks, benefits and alternatives for the proposed anesthesia with the patient or authorized representative who has indicated his/her understanding and acceptance.     Dental advisory given  Plan Discussed with: CRNA  Anesthesia Plan  Comments:         Anesthesia Quick Evaluation

## 2019-04-07 NOTE — Progress Notes (Signed)
Patient denies shortness of breath, fever, cough and chest pain.  PCP - Dr Michell Heinrich [facility] Cardiologist - Denies  Chest x-ray - DOS 04/08/19 EKG - DOS 04/08/19 Stress Test - 12/30/02 CE ECHO - 08/11/98 CE Cardiac Cath - Denies    Fasting Blood Sugar - 90-140s Checks Blood Sugar  2_ times a day  . Do not take oral diabetes medicines (Metformin, Januvia) the morning of surgery.  . THE NIGHT BEFORE SURGERY, take 50% of your dose of Levemir Insulin   (15 units)     . THE MORNING OF SURGERY, None unless your CBG is greater than 220 mg/dL, you may take  of your sliding scale (correction) dose of Levemir Insulin.  . If your blood sugar is less than 70 mg/dL, you will need to treat for low blood sugar: o Treat a low blood sugar (less than 70 mg/dL) with  cup of clear juice (cranberry or apple). o Recheck blood sugar in 15 minutes after treatment (to make sure it is greater than 70 mg/dL). If your blood sugar is not greater than 70 mg/dL on recheck, call [phone] for further instructions.  Anesthesia review: No  STOP now taking any Aspirin (unless otherwise instructed by your surgeon), Aleve, Naproxen, Ibuprofen, Motrin, Advil, Goody's, BC's, all herbal medications, fish oil, and all vitamins.   Coronavirus Screening Have you or experienced the following symptoms:  Cough yes/no: No Fever (>100.55F)  yes/no: No Runny nose yes/no: No Sore throat yes/no: No Difficulty breathing/shortness of breath  yes/no: No  Have you or  traveled in the last 14 days and where? yes/no: No   Covid test 04/07/19 pending.  Patient verbalized understanding of instructions that were given to her via phone.

## 2019-04-08 ENCOUNTER — Ambulatory Visit (HOSPITAL_COMMUNITY): Payer: Worker's Compensation

## 2019-04-08 ENCOUNTER — Observation Stay (HOSPITAL_COMMUNITY)
Admission: RE | Admit: 2019-04-08 | Discharge: 2019-04-09 | Disposition: A | Discharge status: Home / Self Care | Payer: Worker's Compensation | Attending: Orthopedic Surgery | Admitting: Orthopedic Surgery

## 2019-04-08 ENCOUNTER — Other Ambulatory Visit: Payer: Self-pay

## 2019-04-08 ENCOUNTER — Encounter (HOSPITAL_COMMUNITY)
Admission: RE | Disposition: A | Discharge status: Home / Self Care | Payer: Self-pay | Source: Home / Self Care | Attending: Orthopedic Surgery

## 2019-04-08 ENCOUNTER — Ambulatory Visit (HOSPITAL_COMMUNITY): Payer: Worker's Compensation | Admitting: Anesthesiology

## 2019-04-08 ENCOUNTER — Encounter (HOSPITAL_COMMUNITY): Payer: Self-pay

## 2019-04-08 ENCOUNTER — Observation Stay (HOSPITAL_COMMUNITY): Payer: Worker's Compensation

## 2019-04-08 DIAGNOSIS — E119 Type 2 diabetes mellitus without complications: Secondary | ICD-10-CM | POA: Insufficient documentation

## 2019-04-08 DIAGNOSIS — S42301K Unspecified fracture of shaft of humerus, right arm, subsequent encounter for fracture with nonunion: Principal | ICD-10-CM | POA: Insufficient documentation

## 2019-04-08 DIAGNOSIS — K219 Gastro-esophageal reflux disease without esophagitis: Secondary | ICD-10-CM | POA: Insufficient documentation

## 2019-04-08 DIAGNOSIS — E785 Hyperlipidemia, unspecified: Secondary | ICD-10-CM | POA: Diagnosis present

## 2019-04-08 DIAGNOSIS — D649 Anemia, unspecified: Secondary | ICD-10-CM | POA: Diagnosis not present

## 2019-04-08 DIAGNOSIS — Z9012 Acquired absence of left breast and nipple: Secondary | ICD-10-CM | POA: Diagnosis not present

## 2019-04-08 DIAGNOSIS — C801 Malignant (primary) neoplasm, unspecified: Secondary | ICD-10-CM | POA: Diagnosis present

## 2019-04-08 DIAGNOSIS — Z79899 Other long term (current) drug therapy: Secondary | ICD-10-CM | POA: Insufficient documentation

## 2019-04-08 DIAGNOSIS — Z853 Personal history of malignant neoplasm of breast: Secondary | ICD-10-CM | POA: Insufficient documentation

## 2019-04-08 DIAGNOSIS — E559 Vitamin D deficiency, unspecified: Secondary | ICD-10-CM | POA: Diagnosis not present

## 2019-04-08 DIAGNOSIS — Z794 Long term (current) use of insulin: Secondary | ICD-10-CM | POA: Diagnosis not present

## 2019-04-08 DIAGNOSIS — Z01818 Encounter for other preprocedural examination: Secondary | ICD-10-CM

## 2019-04-08 DIAGNOSIS — X58XXXD Exposure to other specified factors, subsequent encounter: Secondary | ICD-10-CM | POA: Diagnosis not present

## 2019-04-08 DIAGNOSIS — T148XXA Other injury of unspecified body region, initial encounter: Secondary | ICD-10-CM

## 2019-04-08 DIAGNOSIS — F329 Major depressive disorder, single episode, unspecified: Secondary | ICD-10-CM | POA: Diagnosis not present

## 2019-04-08 HISTORY — DX: Pneumonia, unspecified organism: J18.9

## 2019-04-08 HISTORY — DX: Anemia, unspecified: D64.9

## 2019-04-08 HISTORY — DX: Depression, unspecified: F32.A

## 2019-04-08 HISTORY — DX: Malignant (primary) neoplasm, unspecified: C80.1

## 2019-04-08 HISTORY — PX: ORIF HUMERUS FRACTURE: SHX2126

## 2019-04-08 HISTORY — DX: Hyperlipidemia, unspecified: E78.5

## 2019-04-08 HISTORY — DX: Unspecified asthma, uncomplicated: J45.909

## 2019-04-08 LAB — URINALYSIS, ROUTINE W REFLEX MICROSCOPIC
Bilirubin Urine: NEGATIVE
Glucose, UA: NEGATIVE mg/dL
Hgb urine dipstick: NEGATIVE
Ketones, ur: NEGATIVE mg/dL
Leukocytes,Ua: NEGATIVE
Nitrite: NEGATIVE
Protein, ur: NEGATIVE mg/dL
Specific Gravity, Urine: 1.008 (ref 1.005–1.030)
pH: 6 (ref 5.0–8.0)

## 2019-04-08 LAB — COMPREHENSIVE METABOLIC PANEL
ALT: 20 U/L (ref 0–44)
AST: 22 U/L (ref 15–41)
Albumin: 3.5 g/dL (ref 3.5–5.0)
Alkaline Phosphatase: 93 U/L (ref 38–126)
Anion gap: 11 (ref 5–15)
BUN: 27 mg/dL — ABNORMAL HIGH (ref 8–23)
CO2: 27 mmol/L (ref 22–32)
Calcium: 9.2 mg/dL (ref 8.9–10.3)
Chloride: 99 mmol/L (ref 98–111)
Creatinine, Ser: 0.77 mg/dL (ref 0.44–1.00)
GFR calc Af Amer: >60 mL/min (ref >60)
GFR calc non Af Amer: >60 mL/min (ref >60)
Glucose, Bld: 78 mg/dL (ref 70–99)
Potassium: 3.9 mmol/L (ref 3.5–5.1)
Sodium: 137 mmol/L (ref 135–145)
Total Bilirubin: 1 mg/dL (ref 0.3–1.2)
Total Protein: 6.7 g/dL (ref 6.5–8.1)

## 2019-04-08 LAB — SEDIMENTATION RATE: Sed Rate: 20 mm/hr (ref 0–22)

## 2019-04-08 LAB — CBC WITH DIFFERENTIAL/PLATELET
Abs Immature Granulocytes: 0.01 10*3/uL (ref 0.00–0.07)
Basophils Absolute: 0.1 10*3/uL (ref 0.0–0.1)
Basophils Relative: 1 %
Eosinophils Absolute: 0.3 10*3/uL (ref 0.0–0.5)
Eosinophils Relative: 5 %
HCT: 29.5 % — ABNORMAL LOW (ref 36.0–46.0)
Hemoglobin: 8.9 g/dL — ABNORMAL LOW (ref 12.0–15.0)
Immature Granulocytes: 0 %
Lymphocytes Relative: 26 %
Lymphs Abs: 1.6 10*3/uL (ref 0.7–4.0)
MCH: 24.4 pg — ABNORMAL LOW (ref 26.0–34.0)
MCHC: 30.2 g/dL (ref 30.0–36.0)
MCV: 80.8 fL (ref 80.0–100.0)
Monocytes Absolute: 0.6 10*3/uL (ref 0.1–1.0)
Monocytes Relative: 10 %
Neutro Abs: 3.7 10*3/uL (ref 1.7–7.7)
Neutrophils Relative %: 58 %
Platelets: 460 10*3/uL — ABNORMAL HIGH (ref 150–400)
RBC: 3.65 MIL/uL — ABNORMAL LOW (ref 3.87–5.11)
RDW: 16.1 % — ABNORMAL HIGH (ref 11.5–15.5)
WBC: 6.4 10*3/uL (ref 4.0–10.5)
nRBC: 0 % (ref 0.0–0.2)

## 2019-04-08 LAB — GLUCOSE, CAPILLARY
Glucose-Capillary: 63 mg/dL — ABNORMAL LOW (ref 70–99)
Glucose-Capillary: 112 mg/dL — ABNORMAL HIGH (ref 70–99)
Glucose-Capillary: 78 mg/dL (ref 70–99)
Glucose-Capillary: 75 mg/dL (ref 70–99)
Glucose-Capillary: 87 mg/dL (ref 70–99)
Glucose-Capillary: 50 mg/dL — ABNORMAL LOW (ref 70–99)
Glucose-Capillary: 124 mg/dL — ABNORMAL HIGH (ref 70–99)

## 2019-04-08 LAB — VITAMIN D 25 HYDROXY (VIT D DEFICIENCY, FRACTURES): Vit D, 25-Hydroxy: 18.89 ng/mL — ABNORMAL LOW (ref 30–100)

## 2019-04-08 LAB — APTT: aPTT: 26 seconds (ref 24–36)

## 2019-04-08 LAB — C-REACTIVE PROTEIN: CRP: <0.8 mg/dL (ref <1.0)

## 2019-04-08 LAB — PROTIME-INR
INR: 1.1 (ref 0.8–1.2)
Prothrombin Time: 14.3 seconds (ref 11.4–15.2)

## 2019-04-08 SURGERY — OPEN REDUCTION INTERNAL FIXATION (ORIF) HUMERAL SHAFT FRACTURE
Anesthesia: General | Laterality: Right

## 2019-04-08 MED ORDER — CLINDAMYCIN PHOSPHATE 600 MG/50ML IV SOLN
600.0000 mg | Freq: Four times a day (QID) | INTRAVENOUS | Status: AC
  Administered 2019-04-08 – 2019-04-09 (×3): 600 mg via INTRAVENOUS
  Filled 2019-04-08 (×3): qty 50

## 2019-04-08 MED ORDER — ROCURONIUM BROMIDE 10 MG/ML (PF) SYRINGE
PREFILLED_SYRINGE | INTRAVENOUS | Status: DC | PRN
  Administered 2019-04-08: 60 mg via INTRAVENOUS

## 2019-04-08 MED ORDER — DEXTROSE 50 % IV SOLN
INTRAVENOUS | Status: AC
  Administered 2019-04-08: 12.5 g via INTRAVENOUS
  Filled 2019-04-08: qty 50

## 2019-04-08 MED ORDER — ACETAMINOPHEN 325 MG PO TABS
650.0000 mg | ORAL_TABLET | Freq: Four times a day (QID) | ORAL | Status: DC
  Administered 2019-04-08 – 2019-04-09 (×4): 650 mg via ORAL
  Filled 2019-04-08 (×4): qty 2

## 2019-04-08 MED ORDER — DEXAMETHASONE SODIUM PHOSPHATE 10 MG/ML IJ SOLN
INTRAMUSCULAR | Status: AC
  Filled 2019-04-08: qty 1

## 2019-04-08 MED ORDER — METOCLOPRAMIDE HCL 5 MG/ML IJ SOLN: 5.0000 mg | Freq: Three times a day (TID) | INTRAMUSCULAR | Status: DC | PRN

## 2019-04-08 MED ORDER — KETOROLAC TROMETHAMINE 30 MG/ML IJ SOLN: 15.0000 mg | Freq: Once | INTRAMUSCULAR | Status: DC | PRN

## 2019-04-08 MED ORDER — PHENOL 1.4 % MT LIQD: 1.0000 | OROMUCOSAL | Status: DC | PRN

## 2019-04-08 MED ORDER — FENTANYL CITRATE (PF) 100 MCG/2ML IJ SOLN
25.0000 ug | INTRAMUSCULAR | Status: DC | PRN
  Administered 2019-04-08: 50 ug via INTRAVENOUS

## 2019-04-08 MED ORDER — BUPIVACAINE-EPINEPHRINE (PF) 0.5% -1:200000 IJ SOLN
INTRAMUSCULAR | Status: DC | PRN
  Administered 2019-04-08: 30 mL via PERINEURAL

## 2019-04-08 MED ORDER — CLONIDINE HCL (ANALGESIA) 100 MCG/ML EP SOLN
EPIDURAL | Status: DC | PRN
  Administered 2019-04-08: 50 ug

## 2019-04-08 MED ORDER — CHLORHEXIDINE GLUCONATE 4 % EX LIQD: 60.0000 mL | Freq: Once | CUTANEOUS | Status: DC

## 2019-04-08 MED ORDER — ROCURONIUM BROMIDE 10 MG/ML (PF) SYRINGE
PREFILLED_SYRINGE | INTRAVENOUS | Status: AC
  Filled 2019-04-08: qty 20

## 2019-04-08 MED ORDER — DOCUSATE SODIUM 100 MG PO CAPS
100.0000 mg | ORAL_CAPSULE | Freq: Two times a day (BID) | ORAL | Status: DC
  Administered 2019-04-08 – 2019-04-09 (×2): 100 mg via ORAL
  Filled 2019-04-08 (×2): qty 1

## 2019-04-08 MED ORDER — MIDAZOLAM HCL 5 MG/5ML IJ SOLN
INTRAMUSCULAR | Status: DC | PRN
  Administered 2019-04-08: 2 mg via INTRAVENOUS

## 2019-04-08 MED ORDER — METOCLOPRAMIDE HCL 5 MG PO TABS: 5.0000 mg | ORAL_TABLET | Freq: Three times a day (TID) | ORAL | Status: DC | PRN

## 2019-04-08 MED ORDER — MIDAZOLAM HCL 2 MG/2ML IJ SOLN
INTRAMUSCULAR | Status: AC
  Filled 2019-04-08: qty 2

## 2019-04-08 MED ORDER — DEXTROSE 50 % IV SOLN
12.5000 g | INTRAVENOUS | Status: AC
  Administered 2019-04-08: 07:00:00 12.5 g via INTRAVENOUS
  Filled 2019-04-08: qty 50

## 2019-04-08 MED ORDER — LIDOCAINE 2% (20 MG/ML) 5 ML SYRINGE
INTRAMUSCULAR | Status: DC | PRN
  Administered 2019-04-08: 50 mg via INTRAVENOUS
  Administered 2019-04-08: 40 mg via INTRAVENOUS

## 2019-04-08 MED ORDER — DEXTROSE 50 % IV SOLN
INTRAVENOUS | Status: DC | PRN
  Administered 2019-04-08 (×2): .25 via INTRAVENOUS

## 2019-04-08 MED ORDER — ONDANSETRON HCL 4 MG/2ML IJ SOLN
INTRAMUSCULAR | Status: AC
  Filled 2019-04-08: qty 2

## 2019-04-08 MED ORDER — HYDROMORPHONE HCL 1 MG/ML IJ SOLN: 0.5000 mg | INTRAMUSCULAR | Status: DC | PRN

## 2019-04-08 MED ORDER — DEXAMETHASONE SODIUM PHOSPHATE 10 MG/ML IJ SOLN
INTRAMUSCULAR | Status: DC | PRN
  Administered 2019-04-08: 10 mg via INTRAVENOUS

## 2019-04-08 MED ORDER — ONDANSETRON HCL 4 MG PO TABS: 4.0000 mg | ORAL_TABLET | Freq: Four times a day (QID) | ORAL | Status: DC | PRN

## 2019-04-08 MED ORDER — MENTHOL 3 MG MT LOZG: 1.0000 | LOZENGE | OROMUCOSAL | Status: DC | PRN

## 2019-04-08 MED ORDER — LACTATED RINGERS IV SOLN
INTRAVENOUS | Status: DC
  Administered 2019-04-08 (×2): via INTRAVENOUS

## 2019-04-08 MED ORDER — SUGAMMADEX SODIUM 200 MG/2ML IV SOLN
INTRAVENOUS | Status: DC | PRN
  Administered 2019-04-08: 200 mg via INTRAVENOUS

## 2019-04-08 MED ORDER — FENTANYL CITRATE (PF) 250 MCG/5ML IJ SOLN
INTRAMUSCULAR | Status: DC | PRN
  Administered 2019-04-08 (×2): 50 ug via INTRAVENOUS

## 2019-04-08 MED ORDER — PROPOFOL 10 MG/ML IV BOLUS
INTRAVENOUS | Status: AC
  Filled 2019-04-08: qty 20

## 2019-04-08 MED ORDER — CLINDAMYCIN PHOSPHATE 900 MG/50ML IV SOLN
900.0000 mg | INTRAVENOUS | Status: AC
  Administered 2019-04-08: 900 mg via INTRAVENOUS
  Filled 2019-04-08: qty 50

## 2019-04-08 MED ORDER — ONDANSETRON HCL 4 MG/2ML IJ SOLN
INTRAMUSCULAR | Status: DC | PRN
  Administered 2019-04-08: 4 mg via INTRAVENOUS

## 2019-04-08 MED ORDER — FENTANYL CITRATE (PF) 250 MCG/5ML IJ SOLN
INTRAMUSCULAR | Status: AC
  Filled 2019-04-08: qty 5

## 2019-04-08 MED ORDER — MEPERIDINE HCL 25 MG/ML IJ SOLN: 6.2500 mg | INTRAMUSCULAR | Status: DC | PRN

## 2019-04-08 MED ORDER — LACTATED RINGERS IV SOLN
INTRAVENOUS | Status: DC
  Administered 2019-04-08: 15:00:00 via INTRAVENOUS

## 2019-04-08 MED ORDER — PROMETHAZINE HCL 25 MG/ML IJ SOLN: 6.2500 mg | INTRAMUSCULAR | Status: DC | PRN

## 2019-04-08 MED ORDER — 0.9 % SODIUM CHLORIDE (POUR BTL) OPTIME
TOPICAL | Status: DC | PRN
  Administered 2019-04-08: 1000 mL

## 2019-04-08 MED ORDER — OXYCODONE HCL 5 MG PO TABS: 5.0000 mg | ORAL_TABLET | ORAL | Status: DC | PRN

## 2019-04-08 MED ORDER — DEXTROSE 50 % IV SOLN
25.0000 g | INTRAVENOUS | Status: AC
  Administered 2019-04-08: 12.5 g via INTRAVENOUS

## 2019-04-08 MED ORDER — SODIUM CHLORIDE 0.9 % IV SOLN
INTRAVENOUS | Status: DC | PRN
  Administered 2019-04-08: 50 ug/min via INTRAVENOUS

## 2019-04-08 MED ORDER — LIDOCAINE 2% (20 MG/ML) 5 ML SYRINGE
INTRAMUSCULAR | Status: AC
  Filled 2019-04-08: qty 15

## 2019-04-08 MED ORDER — DEXTROSE 50 % IV SOLN
INTRAVENOUS | Status: AC
  Filled 2019-04-08: qty 50

## 2019-04-08 MED ORDER — ACETAMINOPHEN 325 MG PO TABS: 325.0000 mg | ORAL_TABLET | Freq: Four times a day (QID) | ORAL | Status: DC | PRN

## 2019-04-08 MED ORDER — FENTANYL CITRATE (PF) 100 MCG/2ML IJ SOLN
INTRAMUSCULAR | Status: AC
  Filled 2019-04-08: qty 2

## 2019-04-08 MED ORDER — ONDANSETRON HCL 4 MG/2ML IJ SOLN: 4.0000 mg | Freq: Four times a day (QID) | INTRAMUSCULAR | Status: DC | PRN

## 2019-04-08 MED ORDER — PROPOFOL 10 MG/ML IV BOLUS
INTRAVENOUS | Status: DC | PRN
  Administered 2019-04-08: 120 mg via INTRAVENOUS

## 2019-04-08 SURGICAL SUPPLY — 81 items
BENZOIN TINCTURE PRP APPL 2/3 (GAUZE/BANDAGES/DRESSINGS) ×6 IMPLANT
BIT DRILL 2.5X110 QC LCP DISP (BIT) ×2 IMPLANT
BIT DRILL 2.8 (BIT) ×1
BIT DRILL CANN QC 2.8X165 (BIT) IMPLANT
BNDG ESMARK 4X9 LF (GAUZE/BANDAGES/DRESSINGS) ×1 IMPLANT
BNDG GAUZE ELAST 4 BULKY (GAUZE/BANDAGES/DRESSINGS) ×6 IMPLANT
BONE CANC CHIPS 20CC PCAN1/4 (Bone Implant) ×3 IMPLANT
BRUSH SCRUB EZ PLAIN DRY (MISCELLANEOUS) ×6 IMPLANT
CHIPS CANC BONE 20CC PCAN1/4 (Bone Implant) ×1 IMPLANT
CORD BIPOLAR FORCEPS 12FT (ELECTRODE) ×3 IMPLANT
COVER SURGICAL LIGHT HANDLE (MISCELLANEOUS) ×6 IMPLANT
DRAPE C-ARM 42X72 X-RAY (DRAPES) ×3 IMPLANT
DRAPE ORTHO SPLIT 77X108 STRL (DRAPES) ×2
DRAPE SURG ORHT 6 SPLT 77X108 (DRAPES) ×2 IMPLANT
DRAPE U-SHAPE 47X51 STRL (DRAPES) ×6 IMPLANT
DRILL BIT 2.8MM (BIT) ×2
DRSG ADAPTIC 3X8 NADH LF (GAUZE/BANDAGES/DRESSINGS) ×3 IMPLANT
DRSG MEPILEX BORDER 4X12 (GAUZE/BANDAGES/DRESSINGS) ×2 IMPLANT
DRSG PAD ABDOMINAL 8X10 ST (GAUZE/BANDAGES/DRESSINGS) ×3 IMPLANT
ELECT REM PT RETURN 9FT ADLT (ELECTROSURGICAL) ×3
ELECTRODE REM PT RTRN 9FT ADLT (ELECTROSURGICAL) ×1 IMPLANT
EVACUATOR 1/8 PVC DRAIN (DRAIN) IMPLANT
GAUZE SPONGE 4X4 12PLY STRL (GAUZE/BANDAGES/DRESSINGS) ×6 IMPLANT
GLOVE BIO SURGEON STRL SZ7.5 (GLOVE) ×3 IMPLANT
GLOVE BIO SURGEON STRL SZ8 (GLOVE) ×3 IMPLANT
GLOVE BIOGEL PI IND STRL 7.5 (GLOVE) ×1 IMPLANT
GLOVE BIOGEL PI IND STRL 8 (GLOVE) ×1 IMPLANT
GLOVE BIOGEL PI INDICATOR 7.5 (GLOVE) ×2
GLOVE BIOGEL PI INDICATOR 8 (GLOVE) ×2
GOWN STRL REUS W/ TWL LRG LVL3 (GOWN DISPOSABLE) ×2 IMPLANT
GOWN STRL REUS W/ TWL XL LVL3 (GOWN DISPOSABLE) ×1 IMPLANT
GOWN STRL REUS W/TWL LRG LVL3 (GOWN DISPOSABLE) ×4
GOWN STRL REUS W/TWL XL LVL3 (GOWN DISPOSABLE) ×2
GRAFT BNE CANC CHIPS 1-8 20CC (Bone Implant) IMPLANT
K-WIRE 2.0X150M (WIRE) ×6
KIT BASIN OR (CUSTOM PROCEDURE TRAY) ×3 IMPLANT
KIT INFUSE LRG II (Orthopedic Implant) ×2 IMPLANT
KIT TURNOVER KIT B (KITS) ×3 IMPLANT
KWIRE 2.0X150M (WIRE) IMPLANT
MANIFOLD NEPTUNE II (INSTRUMENTS) ×3 IMPLANT
NDL HYPO 25X1 1.5 SAFETY (NEEDLE) ×1 IMPLANT
NEEDLE HYPO 25X1 1.5 SAFETY (NEEDLE) IMPLANT
NS IRRIG 1000ML POUR BTL (IV SOLUTION) ×3 IMPLANT
PACK ORTHO EXTREMITY (CUSTOM PROCEDURE TRAY) ×3 IMPLANT
PAD ARMBOARD 7.5X6 YLW CONV (MISCELLANEOUS) ×6 IMPLANT
PROS LCP PLATE 14 189M (Plate) ×3 IMPLANT
PROSTHESIS LCP PLATE 14 189M (Plate) IMPLANT
SCREW CORTEX 3.5 22MM (Screw) ×4 IMPLANT
SCREW CORTEX 3.5 28MM (Screw) ×2 IMPLANT
SCREW CORTEX 3.5 30MM (Screw) ×2 IMPLANT
SCREW CORTEX 3.5 40MM (Screw) ×2 IMPLANT
SCREW LOCK CORT ST 3.5X22 (Screw) IMPLANT
SCREW LOCK CORT ST 3.5X28 (Screw) IMPLANT
SCREW LOCK CORT ST 3.5X30 (Screw) IMPLANT
SCREW LOCK CORT ST 3.5X40 (Screw) IMPLANT
SCREW LOCK T15 FT 22X3.5XST (Screw) IMPLANT
SCREW LOCK T15 FT 36X3.5X2.9X (Screw) IMPLANT
SCREW LOCK T15 FT 40X3.5XST (Screw) IMPLANT
SCREW LOCKING 3.5X22 (Screw) ×4 IMPLANT
SCREW LOCKING 3.5X36 (Screw) ×4 IMPLANT
SCREW LOCKING 3.5X40 (Screw) ×2 IMPLANT
SLING ARM FOAM STRAP MED (SOFTGOODS) ×2 IMPLANT
SPONGE LAP 18X18 RF (DISPOSABLE) ×3 IMPLANT
SPONGE LAP 18X18 X RAY DECT (DISPOSABLE) ×2 IMPLANT
STAPLER VISISTAT 35W (STAPLE) ×3 IMPLANT
SUCTION FRAZIER HANDLE 10FR (MISCELLANEOUS) ×2
SUCTION TUBE FRAZIER 10FR DISP (MISCELLANEOUS) ×1 IMPLANT
SUT ETHILON 3 0 PS 1 (SUTURE) ×6 IMPLANT
SUT PDS AB 2-0 CT1 27 (SUTURE) IMPLANT
SUT VIC AB 0 CT1 27 (SUTURE) ×2
SUT VIC AB 0 CT1 27XBRD ANBCTR (SUTURE) ×2 IMPLANT
SUT VIC AB 2-0 CT1 27 (SUTURE) ×2
SUT VIC AB 2-0 CT1 TAPERPNT 27 (SUTURE) ×2 IMPLANT
SYR 5ML LL (SYRINGE) IMPLANT
SYR CONTROL 10ML LL (SYRINGE) ×1 IMPLANT
TOWEL GREEN STERILE (TOWEL DISPOSABLE) ×9 IMPLANT
TOWEL GREEN STERILE FF (TOWEL DISPOSABLE) ×3 IMPLANT
TUBE CONNECTING 12'X1/4 (SUCTIONS) ×1
TUBE CONNECTING 12X1/4 (SUCTIONS) ×2 IMPLANT
WATER STERILE IRR 1000ML POUR (IV SOLUTION) ×3 IMPLANT
YANKAUER SUCT BULB TIP NO VENT (SUCTIONS) ×4 IMPLANT

## 2019-04-08 NOTE — Anesthesia Procedure Notes (Signed)
Anesthesia Regional Block: Supraclavicular block   Pre-Anesthetic Checklist: ,, timeout performed, Correct Patient, Correct Site, Correct Laterality, Correct Procedure, Correct Position, site marked, Risks and benefits discussed,  Surgical consent,  Pre-op evaluation,  At surgeon's request and post-op pain management  Laterality: Right  Prep: chloraprep       Needles:  Injection technique: Single-shot  Needle Type: Stimiplex          Additional Needles:   Procedures:,,,, ultrasound used (permanent image in chart),,,,  Narrative:  Start time: 04/08/2019 12:40 PM End time: 04/08/2019 12:47 PM Injection made incrementally with aspirations every 5 mL.  Performed by: Personally  Anesthesiologist: Nolon Nations, MD  Additional Notes: Risks, benefits and alternative to block explained extensively preoperatively. Wrist extension and "thumbs up" without weakness. Discussed with Dr. Marcelino Scot and agreed to post op block. Patient tolerated procedure well, without complications.

## 2019-04-08 NOTE — H&P (Signed)
[facility] H&P/Consult     Patient ID: Yvette Mcconnell MRN: [id number] DOB/AGE: [birth year] 68 y.o.  Chief Complaint: Right humerus nonunion HPI: Yvette Mcconnell is an 68 y.o. female.RHD who sustained segmental humeral shaft fracture with union proximally but development of pseudoarthrosis midshaft. She has reduced function, pain, and gross motion at the fracture site, and wishes to proceed with repair.  Past Medical History:  Diagnosis Date  . Anemia   . Arthritis   . Asthma    as a child, no problems as an adult, no inhalers  . Cancer [facility])    Breast Cancer  . Depression   . Diabetes mellitus without complication ([place])    type 2  . GERD (gastroesophageal reflux disease)   . Hyperlipidemia   . Pneumonia    x 1 years ago    Past Surgical History:  Procedure Laterality Date  . ABDOMINAL HYSTERECTOMY    . APPENDECTOMY    . BONE BIOPSY Left 03/13/2018   Procedure: BONE BIOPSY 1ST METATARSAL HEAD LEFT FOOT;  Surgeon: Tyson Babinski, DPM;  Location: [facility];  Service: Podiatry;  Laterality: Left;  . COLONOSCOPY    . FOOT SURGERY    . INCISION AND DRAINAGE Left 03/13/2018   Procedure: INCISION AND DRAINAGE LEFT FOOT ULCER;  Surgeon: Tyson Babinski, DPM;  Location: [facility];  Service: Podiatry;  Laterality: Left;  Marland Kitchen MASTECTOMY Left   . rotator cuff repair Left   . SESAMOIDECTOMY Left 03/13/2018   Procedure: TIBIAL AND FIBULAR SESAMOIDECTOMY LEFT FOOT;  Surgeon: Tyson Babinski, DPM;  Location: [facility];  Service: Podiatry;  Laterality: Left;  . TONSILLECTOMY    . UPPER GI ENDOSCOPY      History reviewed. No pertinent family history. Social History:  reports that she has never smoked. She has never used smokeless tobacco. She reports that she does not drink alcohol or use drugs.  Allergies:  Allergies  Allergen Reactions  . Morphine And Related Other (See Comments)    Knocked her out/ to strong  . Amoxicillin Rash    Has patient  had a PCN reaction causing immediate rash, facial/tongue/throat swelling, SOB or lightheadedness with hypotension: Yes Has patient had a PCN reaction causing severe rash involving mucus membranes or skin necrosis: No Has patient had a PCN reaction that required hospitalization: Yes Has patient had a PCN reaction occurring within the last 10 years: No If all of the above answers are "NO", then may proceed with Cephalosporin use.   Marland Kitchen Hydrocodone Rash    Medications Prior to Admission  Medication Sig Dispense Refill  . ferrous sulfate 325 (65 FE) MG tablet Take 325 mg by mouth daily.     Marland Kitchen ibuprofen (ADVIL,MOTRIN) 200 MG tablet Take 800 mg by mouth 2 (two) times daily as needed for headache or moderate pain.    . Insulin Detemir (LEVEMIR FLEXTOUCH [place]) Inject 30 Units into the skin every evening.     . meloxicam (MOBIC) 7.5 MG tablet Take 7.5 mg by mouth daily as needed for pain.  1  . metFORMIN (GLUCOPHAGE) 1000 MG tablet Take 1,000 mg by mouth 2 (two) times daily with a meal.    . omeprazole (PRILOSEC) 20 MG capsule Take 20 mg by mouth daily.     Marland Kitchen PARoxetine (PAXIL) 20 MG tablet Take 20 mg by mouth daily.   1  . rosuvastatin (CRESTOR) 10 MG tablet Take 10 mg by mouth daily.    . sitaGLIPtin (JANUVIA) 100 MG  tablet Take 100 mg by mouth daily.       Results for orders placed or performed during the hospital encounter of 04/08/19 (from the past 48 hour(s))  Glucose, capillary     Status: Abnormal   Collection Time: 04/08/19  6:05 AM  Result Value Ref Range   Glucose-Capillary 63 (L) 70 - 99 mg/dL  Urinalysis, Routine w reflex microscopic     Status: Abnormal   Collection Time: 04/08/19  6:20 AM  Result Value Ref Range   Color, Urine STRAW (A) YELLOW   APPearance CLEAR CLEAR   Specific Gravity, Urine 1.008 1.005 - 1.030   pH 6.0 5.0 - 8.0   Glucose, UA NEGATIVE NEGATIVE mg/dL   Hgb urine dipstick NEGATIVE NEGATIVE   Bilirubin Urine NEGATIVE NEGATIVE   Ketones, ur NEGATIVE NEGATIVE  mg/dL   Protein, ur NEGATIVE NEGATIVE mg/dL   Nitrite NEGATIVE NEGATIVE   Leukocytes,Ua NEGATIVE NEGATIVE    Comment: Performed at [facility] [address] [place], [place] [address]  C-reactive protein     Status: None   Collection Time: 04/08/19  6:24 AM  Result Value Ref Range   CRP <0.8 <1.0 mg/dL    Comment: Performed at [facility], [place] [address] [place], [place] [address]  CBC WITH DIFFERENTIAL     Status: Abnormal   Collection Time: 04/08/19  6:24 AM  Result Value Ref Range   WBC 6.4 4.0 - 10.5 K/uL   RBC 3.65 (L) 3.87 - 5.11 MIL/uL   Hemoglobin 8.9 (L) 12.0 - 15.0 g/dL   HCT 29.5 (L) 36.0 - 46.0 %   MCV 80.8 80.0 - 100.0 fL   MCH 24.4 (L) 26.0 - 34.0 pg   MCHC 30.2 30.0 - 36.0 g/dL   RDW 16.1 (H) 11.5 - 15.5 %   Platelets 460 (H) 150 - 400 K/uL   nRBC 0.0 0.0 - 0.2 %   Neutrophils Relative % 58 %   Neutro Abs 3.7 1.7 - 7.7 K/uL   Lymphocytes Relative 26 %   Lymphs Abs 1.6 0.7 - 4.0 K/uL   Monocytes Relative 10 %   Monocytes Absolute 0.6 0.1 - 1.0 K/uL   Eosinophils Relative 5 %   Eosinophils Absolute 0.3 0.0 - 0.5 K/uL   Basophils Relative 1 %   Basophils Absolute 0.1 0.0 - 0.1 K/uL   Immature Granulocytes 0 %   Abs Immature Granulocytes 0.01 0.00 - 0.07 K/uL    Comment: Performed at [facility], [address] [place], [place] [address]  Comprehensive metabolic panel     Status: Abnormal   Collection Time: 04/08/19  6:24 AM  Result Value Ref Range   Sodium 137 135 - 145 mmol/L   Potassium 3.9 3.5 - 5.1 mmol/L   Chloride 99 98 - 111 mmol/L   CO2 27 22 - 32 mmol/L   Glucose, Bld 78 70 - 99 mg/dL   BUN 27 (H) 8 - 23 mg/dL   Creatinine, Ser 0.77 0.44 - 1.00 mg/dL   Calcium 9.2 8.9 - 10.3 mg/dL   Total Protein 6.7 6.5 - 8.1 g/dL   Albumin 3.5 3.5 - 5.0 g/dL   AST 22 15 - 41 U/L   ALT 20 0 - 44 U/L   Alkaline Phosphatase 93 38 - 126 U/L   Total Bilirubin 1.0 0.3 - 1.2 mg/dL   GFR calc non Af Amer >60 >60 mL/min   GFR calc Af Amer  >60 >60 mL/min  Anion gap 11 5 - 15    Comment: Performed at [facility] [address] [place], [place] [address]  Protime-INR     Status: None   Collection Time: 04/08/19  6:24 AM  Result Value Ref Range   Prothrombin Time 14.3 11.4 - 15.2 seconds   INR 1.1 0.8 - 1.2    Comment: (NOTE) INR goal varies based on device and disease states. Performed at [facility], [place] [address] [place], [place] [address]   APTT     Status: None   Collection Time: 04/08/19  6:24 AM  Result Value Ref Range   aPTT 26 24 - 36 seconds    Comment: Performed at [facility] [address], [place], [place] [address]  Glucose, capillary     Status: Abnormal   Collection Time: 04/08/19  7:06 AM  Result Value Ref Range   Glucose-Capillary 112 (H) 70 - 99 mg/dL   Comment 1 Notify RN    Comment 2 Document in Chart   Glucose, capillary     Status: None   Collection Time: 04/08/19  8:06 AM  Result Value Ref Range   Glucose-Capillary 78 70 - 99 mg/dL   Dg Chest Portable 1 View  Result Date: 04/08/2019 CLINICAL DATA:  Preoperative study for right humerus surgery. EXAM: PORTABLE CHEST 1 VIEW COMPARISON:  No prior. FINDINGS: Mediastinum hilar structures normal. Mild right base subsegmental atelectasis. No focal alveolar infiltrate. No pleural effusion. No pneumothorax. Comminuted right humeral fracture noted. IMPRESSION: 1.  Mild right base subsegmental atelectasis. 2.  Comminuted right humeral fracture noted. Electronically Signed   By: Marcello Moores  Register   On: 04/08/2019 07:37    ROS No recent fever, bleeding abnormalities, urologic dysfunction, GI problems, or weight gain.  Blood pressure (!) 157/66, pulse 82, temperature 98 F (36.7 C), temperature source Oral, resp. rate 18, height 5\' 4"  (1.626 m), weight 62.6 kg, SpO2 100 %. Physical Exam NCAT RRR No wheezing RUEx   Motion midshaft  Sens  Ax/R/M/U intact  Mot   Ax/ R/ PIN/ M/ AIN/ U intact  Brisk CR, Rad 2+    Assessment/Plan  Right humeral shaft nonunion  I discussed with the patient the risks and benefits of surgery to repair her right humeral shaft nonunion, including the possibility of infection, nerve injury, vessel injury, wound breakdown, arthritis, symptomatic hardware, DVT/ PE, loss of motion, malunion, persistent nonunion, and need for further surgery among others.  We also specifically discussed the need to stage surgery because of the elevated risk of radial nerve injury. She acknowledged these risks and wished to proceed.   Altamese Belvidere, MD [facility], [facility] [phone]  04/08/2019, 8:09 AM  [facility] [place] [address] [phone] (F)

## 2019-04-08 NOTE — Plan of Care (Signed)
  Problem: Education: Goal: Knowledge of General Education information will improve Description: Including pain rating scale, medication(s)/side effects and non-pharmacologic comfort measures Outcome: Progressing   Problem: Health Behavior/Discharge Planning: Goal: Ability to manage health-related needs will improve Outcome: Progressing   Problem: Activity: Goal: Risk for activity intolerance will decrease Outcome: Progressing   Problem: Elimination: Goal: Will not experience complications related to bowel motility Outcome: Progressing   Problem: Pain Managment: Goal: General experience of comfort will improve Outcome: Progressing   Problem: Safety: Goal: Ability to remain free from injury will improve Outcome: Progressing   Problem: Skin Integrity: Goal: Risk for impaired skin integrity will decrease Outcome: Progressing   

## 2019-04-08 NOTE — Anesthesia Procedure Notes (Signed)
Procedure Name: Intubation Date/Time: 04/08/2019 8:30 AM Performed by: Mariea Clonts, CRNA Pre-anesthesia Checklist: Patient identified, Emergency Drugs available, Suction available and Patient being monitored Patient Re-evaluated:Patient Re-evaluated prior to induction Oxygen Delivery Method: Circle System Utilized Preoxygenation: Pre-oxygenation with 100% oxygen Induction Type: IV induction Ventilation: Mask ventilation without difficulty and Oral airway inserted - appropriate to patient size Laryngoscope Size: Mac and 3 Grade View: Grade II Tube type: Oral Tube size: 7.0 mm Number of attempts: 1 Airway Equipment and Method: Stylet and Oral airway Placement Confirmation: ETT inserted through vocal cords under direct vision,  positive ETCO2 and breath sounds checked- equal and bilateral Tube secured with: Tape Dental Injury: Teeth and Oropharynx as per pre-operative assessment

## 2019-04-08 NOTE — Transfer of Care (Signed)
Immediate Anesthesia Transfer of Care Note  Patient: Yvette Mcconnell  Procedure(s) Performed: OPEN REDUCTION INTERNAL FIXATION  HUMERAL SHAFT NONUNION (Right )  Patient Location: PACU  Anesthesia Type:General  Level of Consciousness: awake, alert  and oriented  Airway & Oxygen Therapy: Patient Spontanous Breathing and Patient connected to nasal cannula oxygen  Post-op Assessment: Report given to RN, Post -op Vital signs reviewed and stable and Patient moving all extremities X 4  Post vital signs: Reviewed and stable  Last Vitals:  Vitals Value Taken Time  BP 133/73 04/08/19 1140  Temp    Pulse 151 04/08/19 1141  Resp 15 04/08/19 1141  SpO2 100 % 04/08/19 1141  Vitals shown include unvalidated device data.  Last Pain:  Vitals:   04/08/19 0702  TempSrc: Oral  PainSc:          Complications: No apparent anesthesia complications

## 2019-04-08 NOTE — Progress Notes (Signed)
Supraclaviular block done by Dr Lissa Hoard and assited by Dr Gifford Shave / uneventful procedure and pain relief achieved

## 2019-04-08 NOTE — Anesthesia Postprocedure Evaluation (Signed)
Anesthesia Post Note  Patient: Yvette Mcconnell  Procedure(s) Performed: OPEN REDUCTION INTERNAL FIXATION  HUMERAL SHAFT NONUNION (Right )     Patient location during evaluation: PACU Anesthesia Type: General Level of consciousness: sedated and patient cooperative Pain management: pain level controlled Vital Signs Assessment: post-procedure vital signs reviewed and stable Respiratory status: spontaneous breathing Cardiovascular status: stable Anesthetic complications: no    Last Vitals:  Vitals:   04/08/19 1203 04/08/19 1223  BP: (!) 154/85 (!) 156/69  Pulse: 96 93  Resp: 18 13  Temp:    SpO2: 95% (!) 84%    Last Pain:  Vitals:   04/08/19 1140  TempSrc:   PainSc: [place]

## 2019-04-08 NOTE — Op Note (Signed)
01/31/2019  9:03 AM  PATIENT:  Yvette Mcconnell 03-29-1951  PRE-OPERATIVE DIAGNOSIS:  RIGHT HUMERUS NONUNION  POST-OPERATIVE DIAGNOSIS:  RIGHT HUMERUS NONUNION  PROCEDURE:  Procedure(s): OPEN REDUCTION INTERNAL FIXATION (ORIF) RIGHT HUMERAL SHAFT NONUNION  SURGEON:  Surgeon(s) and Role:    Altamese Clayton, MD - Primary  PHYSICIAN ASSISTANT: Ainsley Spinner, PA-C  ANESTHESIA:   GENERAL  EBL:  75 mL   BLOOD ADMINISTERED:none  DRAINS: none   LOCAL MEDICATIONS USED:  NONE  SPECIMEN:  No Specimen  DISPOSITION OF SPECIMEN:  N/A  COUNTS:  YES  TOURNIQUET:  * No tourniquets in log *  DICTATION: .Note written in EPIC  PLAN OF CARE: Admit to inpatient   PATIENT DISPOSITION:  PACU - hemodynamically stable.   Delay start of Pharmacological VTE agent (>24hrs) due to surgical blood loss or risk of bleeding: no  BRIEF SUMMARY AND INDICATIONS FOR PROCEDURE:  Yvette Mcconnell is a 68 y.o. who sustained a comminuted right humerus fracture that failed prolonged nonsurgical managemement. I discussed with the patient the risks and benefits of surgery, including the possibility of infection, nerve injury, vessel injury, wound breakdown, arthritis, symptomatic hardware, DVT/ PE, loss of motion, malunion, persistent nonunion, and need for further surgery among others.  These risks were acknowledged and consent provided to proceed.  BRIEF SUMMARY OF PROCEDURE:  After administration of preoperative antibiotics, the patient was taken to the OR where general anesthesia was induced.  The upper extremity was washed with chlorhexidine soap and then a standard Betadine scrub and paint was performed.  This was followed by application of sterile drapes in standard fashion.  A timeout was held.  A standard anterior approach was made through a long incision over the anterolateral aspect of the arm.  Dissection was carried down through the soft tissues carefully to protect the cephalic vein, which was retracted  laterally.  The biceps fascia was incised and the biceps retracted medially.  This exposed the brachialis which was then split midline.  We encountered a pseudoarthrosis within the fracture site.  With the help of my assistant, careful retraction allowed for removal of pseudocapsule with use of curettes and lavage at each bone surface.  I was careful to protect adjacent periosteal attachments at all times.  With the help of my assistant, tenaculums were placed  under direct visualization.  The fracture fragments were reduced and then fixed with the Synthes modular foot set screws.  To secure lag fixation, I overdrilled the near cortex and used a countersink, then secured fixation in the far cortex to achieve compression. I then brought in a C-arm and on AP and lateral views confirmed appropriate reduction and screw placement.  This was followed by application of a long 3.5 Synthes LC-DCP plate.  I secured minimum of 8 cortices of fixation both proximal and distally, using standard fixation first then locked  fixation.  Ainsley Spinner PA-C assisted me throughout and an assistant was required to protect the neurovascular bundle, to achieve reduction and also for assistance during  provisional and definitive fixation and with wound closure.  The brachialis was repaired with 0 Vicryl and then 0 Vicryl for the deep subcu, 2-0 Vicryl and 2-0 nylon for the subcuticular layer and skin.  A sterile gentle compressive dressing was applied and then an Ace wrap from  hand to upper arm.    There were no complications during the procedure.  PROGNOSIS:  The patient will have unrestricted passive and active range of motion of the  elbow, wrist, and digits. Passive and gentle active assisted motion of the shoulder is encouraged, but no active abduction against resistance (including gravity). May shower in two days and leave wound open to air. Remove sutures at 10-14 days.

## 2019-04-09 ENCOUNTER — Encounter (HOSPITAL_COMMUNITY): Payer: Self-pay | Admitting: Orthopedic Surgery

## 2019-04-09 DIAGNOSIS — E119 Type 2 diabetes mellitus without complications: Secondary | ICD-10-CM

## 2019-04-09 DIAGNOSIS — C801 Malignant (primary) neoplasm, unspecified: Secondary | ICD-10-CM | POA: Diagnosis present

## 2019-04-09 DIAGNOSIS — E785 Hyperlipidemia, unspecified: Secondary | ICD-10-CM | POA: Diagnosis present

## 2019-04-09 DIAGNOSIS — K219 Gastro-esophageal reflux disease without esophagitis: Secondary | ICD-10-CM | POA: Diagnosis present

## 2019-04-09 DIAGNOSIS — E559 Vitamin D deficiency, unspecified: Secondary | ICD-10-CM

## 2019-04-09 DIAGNOSIS — S42301K Unspecified fracture of shaft of humerus, right arm, subsequent encounter for fracture with nonunion: Secondary | ICD-10-CM | POA: Diagnosis not present

## 2019-04-09 HISTORY — DX: Vitamin D deficiency, unspecified: E55.9

## 2019-04-09 LAB — HEMOGLOBIN A1C
Hgb A1c MFr Bld: 8 % — ABNORMAL HIGH (ref 4.8–5.6)
Mean Plasma Glucose: 182.9 mg/dL

## 2019-04-09 LAB — GLUCOSE, CAPILLARY
Glucose-Capillary: 177 mg/dL — ABNORMAL HIGH (ref 70–99)
Glucose-Capillary: 136 mg/dL — ABNORMAL HIGH (ref 70–99)

## 2019-04-09 MED ORDER — PAROXETINE HCL 20 MG PO TABS
20.0000 mg | ORAL_TABLET | Freq: Every day | ORAL | Status: DC
  Administered 2019-04-09: 20 mg via ORAL
  Filled 2019-04-09: qty 1

## 2019-04-09 MED ORDER — ASCORBIC ACID 500 MG PO TABS: 500.0000 mg | ORAL_TABLET | Freq: Every day | ORAL | 0 refills | Status: DC

## 2019-04-09 MED ORDER — DOCUSATE SODIUM 100 MG PO CAPS: 100.0000 mg | ORAL_CAPSULE | Freq: Two times a day (BID) | ORAL | 0 refills | Status: DC

## 2019-04-09 MED ORDER — VITAMIN C 500 MG PO TABS
500.0000 mg | ORAL_TABLET | Freq: Every day | ORAL | Status: DC
  Administered 2019-04-09: 500 mg via ORAL
  Filled 2019-04-09: qty 1

## 2019-04-09 MED ORDER — VITAMIN D 25 MCG (1000 UNIT) PO TABS
2000.0000 [IU] | ORAL_TABLET | Freq: Two times a day (BID) | ORAL | Status: DC
  Administered 2019-04-09: 2000 [IU] via ORAL
  Filled 2019-04-09: qty 2

## 2019-04-09 MED ORDER — METFORMIN HCL 500 MG PO TABS: 1000.0000 mg | ORAL_TABLET | Freq: Two times a day (BID) | ORAL | Status: DC

## 2019-04-09 MED ORDER — INSULIN ASPART 100 UNIT/ML ~~LOC~~ SOLN: 0.0000 [IU] | Freq: Three times a day (TID) | SUBCUTANEOUS | Status: DC

## 2019-04-09 MED ORDER — METHOCARBAMOL 500 MG PO TABS: 500.0000 mg | ORAL_TABLET | Freq: Four times a day (QID) | ORAL | 0 refills | Status: DC | PRN

## 2019-04-09 MED ORDER — PANTOPRAZOLE SODIUM 40 MG PO TBEC
40.0000 mg | DELAYED_RELEASE_TABLET | Freq: Every day | ORAL | Status: DC
  Administered 2019-04-09: 40 mg via ORAL
  Filled 2019-04-09: qty 1

## 2019-04-09 MED ORDER — METHOCARBAMOL 500 MG PO TABS
500.0000 mg | ORAL_TABLET | Freq: Four times a day (QID) | ORAL | Status: DC | PRN
  Administered 2019-04-09: 500 mg via ORAL
  Filled 2019-04-09: qty 1

## 2019-04-09 MED ORDER — TRAMADOL HCL 50 MG PO TABS: 50.0000 mg | ORAL_TABLET | Freq: Four times a day (QID) | ORAL | Status: DC | PRN

## 2019-04-09 MED ORDER — INSULIN DETEMIR 100 UNIT/ML ~~LOC~~ SOLN
30.0000 [IU] | Freq: Every evening | SUBCUTANEOUS | Status: DC
  Filled 2019-04-09: qty 0.3

## 2019-04-09 MED ORDER — VITAMIN D3 25 MCG PO TABS: 2000.0000 [IU] | ORAL_TABLET | Freq: Two times a day (BID) | ORAL | 2 refills | Status: DC

## 2019-04-09 MED ORDER — TRAMADOL HCL 50 MG PO TABS: 50.0000 mg | ORAL_TABLET | Freq: Three times a day (TID) | ORAL | 0 refills | Status: DC | PRN

## 2019-04-09 MED ORDER — ACETAMINOPHEN 500 MG PO TABS: 1000.0000 mg | ORAL_TABLET | Freq: Three times a day (TID) | ORAL | 1 refills | Status: AC

## 2019-04-09 MED FILL — METHOCARBAMOL 500 MG TABS: 500 | 15 days supply | Qty: 60 | Fill #0

## 2019-04-09 MED FILL — DOK 100 MG CAPS: 100 | 10 days supply | Qty: 20 | Fill #0

## 2019-04-09 MED FILL — VITAMIN D3 1,000 UNIT TAB: 25 MCG | 30 days supply | Qty: 120 | Fill #0

## 2019-04-09 MED FILL — traMADol HCL 50 MG TABS: 50 | 7 days supply | Qty: 50 | Fill #0

## 2019-04-09 MED FILL — VITAMIN C 500 MG TABLET: 500 | 60 days supply | Qty: 60 | Fill #0

## 2019-04-09 MED FILL — ACETAMINOPHEN 500MG XT STRE: 500 | 15 days supply | Qty: 90 | Fill #0

## 2019-04-09 NOTE — Progress Notes (Signed)
Orthopaedic Trauma Service (OTS)  1 Day Post-Op Procedure(s) (LRB): OPEN REDUCTION INTERNAL FIXATION  HUMERAL SHAFT NONUNION (Right)  Subjective: Patient reports pain as minimal currently.    Objective: Current Vitals Blood pressure (!) 105/47, pulse 86, temperature 98.6 F (37 C), temperature source Oral, resp. rate 16, height 5\' 4"  (1.626 m), weight 62.6 kg, SpO2 96 %. Vital signs in last 24 hours: Temp:  [97.6 F (36.4 C)-98.6 F (37 C)] 98.6 F (37 C) (10/21 [id number]) Pulse Rate:  [72-110] 86 (10/21 0812) Resp:  [13-20] 16 (10/21 0812) BP: (93-156)/(45-115) 105/47 (10/21 0812) SpO2:  [94 %-100 %] 96 % (10/21 0812)  Intake/Output from previous day: 10/20 0701 - 10/21 0700 In: 1259.2 [P.O.:240; I.V.:1003.5; IV Piggyback:15.8] Out: 50 [Blood:50]  LABS Recent Labs    04/08/19 0624  HGB 8.9*   Recent Labs    04/08/19 0624  WBC 6.4  RBC 3.65*  HCT 29.5*  PLT 460*   Recent Labs    04/08/19 0624  NA 137  K 3.9  CL 99  CO2 27  BUN 27*  CREATININE 0.77  GLUCOSE 78  CALCIUM 9.2   Recent Labs    04/08/19 0624  INR 1.1     Physical Exam RUE Splint in place  Sens  R/M/U still absent from the block  Mot   Ax/ R/ PIN/ M/ AIN/ U intact  Brisk CR, Rad 2+  Assessment/Plan: 1 Day Post-Op Procedure(s) (LRB): OPEN REDUCTION INTERNAL FIXATION  HUMERAL SHAFT NONUNION (Right) 1. PT/OT  2. DVT proph Other (comment) N/A 3. F/u 8-14 days  Altamese Coldiron, MD [facility] [phone] (p)

## 2019-04-09 NOTE — Discharge Instructions (Signed)
Orthopaedic Trauma Service Discharge Instructions   General Discharge Instructions  WEIGHT BEARING STATUS: Nonweightbearing right arm, no lifting more than 5 lbs with Right arm  RANGE OF MOTION/ACTIVITY: sling only on when up moving around.  Unrestricted range of motion right elbow, forearm, wrist and hand. No lifting shoulder to the side on your own (chicken wing motion) ok to move shoulder forward and back like a hinge motion   Bone health:  Your vitamin d levels are low.  Recommend taking 4000-5000 IUs of vitamin d3 daily (a prescription has been written for this). Would also take vitamin c daily (500 mg daily) for soft tissue healing. Continue to eat a good diet to get your calcium through regular food  Wound Care: daily wound care starting on 04/10/2019. See below  Discharge Wound Care Instructions  Do NOT apply any ointments, solutions or lotions to pin sites or surgical wounds.  These prevent needed drainage and even though solutions like hydrogen peroxide kill bacteria, they also damage cells lining the pin sites that help fight infection.  Applying lotions or ointments can keep the wounds moist and can cause them to breakdown and open up as well. This can increase the risk for infection. When in doubt call the office.  Surgical incisions should be dressed daily.  If any drainage is noted, use one layer of adaptic, then gauze, Kerlix, and an ace wrap.  Once the incision is completely dry and without drainage, it may be left open to air out.  Showering may begin 36-48 hours later.  Cleaning gently with soap and water.  Traumatic wounds should be dressed daily as well.    One layer of adaptic, gauze, Kerlix, then ace wrap.  The adaptic can be discontinued once the draining has ceased    If you have a wet to dry dressing: wet the gauze with saline the squeeze as much saline out so the gauze is moist (not soaking wet), place moistened gauze over wound, then place a dry gauze over  the moist one, followed by Kerlix wrap, then ace wrap.    Diet: as you were eating previously.  Can use over the counter stool softeners and bowel preparations, such as Miralax, to help with bowel movements.  Narcotics can be constipating.  Be sure to drink plenty of fluids  PAIN MEDICATION USE AND EXPECTATIONS  You have likely been given narcotic medications to help control your pain.  After a traumatic event that results in an fracture (broken bone) with or without surgery, it is ok to use narcotic pain medications to help control one's pain.  We understand that everyone responds to pain differently and each individual patient will be evaluated on a regular basis for the continued need for narcotic medications. Ideally, narcotic medication use should last no more than 6-8 weeks (coinciding with fracture healing).   As a patient it is your responsibility as well to monitor narcotic medication use and report the amount and frequency you use these medications when you come to your office visit.   We would also advise that if you are using narcotic medications, you should take a dose prior to therapy to maximize you participation.  IF YOU ARE ON NARCOTIC MEDICATIONS IT IS NOT PERMISSIBLE TO OPERATE A MOTOR VEHICLE (MOTORCYCLE/CAR/TRUCK/MOPED) OR HEAVY MACHINERY DO NOT MIX NARCOTICS WITH OTHER CNS (CENTRAL NERVOUS SYSTEM) DEPRESSANTS SUCH AS ALCOHOL   STOP SMOKING OR USING NICOTINE PRODUCTS!!!!  As discussed nicotine severely impairs your body's ability to heal surgical  and traumatic wounds but also impairs bone healing.  Wounds and bone heal by forming microscopic blood vessels (angiogenesis) and nicotine is a vasoconstrictor (essentially, shrinks blood vessels).  Therefore, if vasoconstriction occurs to these microscopic blood vessels they essentially disappear and are unable to deliver necessary nutrients to the healing tissue.  This is one modifiable factor that you can do to dramatically increase  your chances of healing your injury.    (This means no smoking, no nicotine gum, patches, etc)  DO NOT USE NONSTEROIDAL ANTI-INFLAMMATORY DRUGS (NSAID'S)  Using products such as Advil (ibuprofen), Aleve (naproxen), Motrin (ibuprofen) for additional pain control during fracture healing can delay and/or prevent the healing response.  If you would like to take over the counter (OTC) medication, Tylenol (acetaminophen) is ok.  However, some narcotic medications that are given for pain control contain acetaminophen as well. Therefore, you should not exceed more than 4000 mg of tylenol in a day if you do not have liver disease.  Also note that there are may OTC medicines, such as cold medicines and allergy medicines that my contain tylenol as well.  If you have any questions about medications and/or interactions please ask your doctor/PA or your pharmacist.      ICE AND ELEVATE INJURED/OPERATIVE EXTREMITY  Using ice and elevating the injured extremity above your heart can help with swelling and pain control.  Icing in a pulsatile fashion, such as 20 minutes on and 20 minutes off, can be followed.    Do not place ice directly on skin. Make sure there is a barrier between to skin and the ice pack.    Using frozen items such as frozen peas works well as the conform nicely to the are that needs to be iced.  USE AN ACE WRAP OR TED HOSE FOR SWELLING CONTROL  In addition to icing and elevation, Ace wraps or TED hose are used to help limit and resolve swelling.  It is recommended to use Ace wraps or TED hose until you are informed to stop.    When using Ace Wraps start the wrapping distally (farthest away from the body) and wrap proximally (closer to the body)   Example: If you had surgery on your leg or thing and you do not have a splint on, start the ace wrap at the toes and work your way up to the thigh        If you had surgery on your upper extremity and do not have a splint on, start the ace wrap at your  fingers and work your way up to the upper arm  IF YOU ARE IN A SPLINT OR CAST DO NOT [place]   If your splint gets wet for any reason please contact the office immediately. You may shower in your splint or cast as long as you keep it dry.  This can be done by wrapping in a cast cover or garbage back (or similar)  Do Not stick any thing down your splint or cast such as pencils, money, or hangers to try and scratch yourself with.  If you feel itchy take benadryl as prescribed on the bottle for itching  IF YOU ARE IN A CAM BOOT (BLACK BOOT)  You may remove boot periodically. Perform daily dressing changes as noted below.  Wash the liner of the boot regularly and wear a sock when wearing the boot. It is recommended that you sleep in the boot until told otherwise    Call  office for the following:  Temperature greater than 101F  Persistent nausea and vomiting  Severe uncontrolled pain  Redness, tenderness, or signs of infection (pain, swelling, redness, odor or green/yellow discharge around the site)  Difficulty breathing, headache or visual disturbances  Hives  Persistent dizziness or light-headedness  Extreme fatigue  Any other questions or concerns you may have after discharge  In an emergency, call 911 or go to an Emergency Department at a nearby hospital    [place]: [phone]   VISIT OUR WEBSITE FOR ADDITIONAL INFORMATION: [email or web address]

## 2019-04-09 NOTE — Discharge Summary (Signed)
Orthopaedic Trauma Service (OTS) Discharge Summary   Patient ID: DESMONIQUE HELFMAN MRN: [id number] DOB/AGE: Feb 11, 1951 68 y.o.  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 Handy  Repair of right humeral shaft nonunion and pseudoarthrosis Allografting nonunion site   Discharged Condition: good  Hospital Course:   Patient is a pleasant 68 year old female who sustained a segmental right humerus fracture some months ago.  She did heal the proximal aspect of her fracture however the shaft component went on to nonunion with pseudoarthrosis.  She was referred to the orthopedic trauma specialist for evaluation and elected to proceed with surgical intervention.  Patient was taken to the operating room on 04/08/2019 for the procedure noted above.  She tolerated the procedure very well however she did have significant amount of pain postoperatively and opted for overnight stay.  In the PACU a block was placed by anesthesia which did provide excellent pain relief.  Patient was admitted overnight for observation and therapies.  On postoperative day #1 she was doing fantastic.  Her block was still functional however her motor component had  returned.  She was wishing to discharge home on postoperative day #1.  Patient discharged in stable condition on 04/09/2019 after therapies have been completed.  Wound care and sling wear have been reviewed with the patient.  She will follow up to the orthopedic trauma service in 10 to 14 days for follow-up x-rays and suture removal.  We did obtain vitamin D levels on her which were notable for vitamin D deficiency.  She was started on vitamin D supplementation 4000 IUs of vitamin D3 daily and vitamin C 500 mg daily.  Given her age and history we would recommend DEXA scan in the next 4 to 8 weeks to further evaluate her bone health.   Patient was covered with routine perioperative antibiotic coverage.  She did not require any pharmacologic DVT or PE prophylaxis as this is an upper extremity injury and she is not an active cancer patient   Consults: None  Significant Diagnostic Studies: labs:   Results for SECRET, CRADLE (MRN [id number]) as of 04/09/2019 09:32  Ref. Range 04/08/2019 06:24  CRP Latest Ref Range: <1.0 mg/dL <0.8  Vitamin D, 25-Hydroxy Latest Ref Range: 30 - 100 ng/mL 18.89 (L)  Results for DALEYSSA, PULLIAM (MRN [id number]) as of 04/09/2019 09:32  Ref. Range 04/08/2019 06:24  Sed Rate Latest Ref Range: 0 - 22 mm/hr 20   Treatments: IV hydration, antibiotics: Clindamycin, analgesia: acetaminophen and Ultram, therapies: PT, OT and RN and surgery: As above  Discharge Exam:    Orthopaedic Trauma Service (OTS)  1 Day Post-Op Procedure(s) (LRB): OPEN REDUCTION INTERNAL FIXATION  HUMERAL  SHAFT NONUNION (Right)  Subjective: Patient reports pain as minimal currently.    Objective: Current Vitals Blood pressure (!) 105/47, pulse 86, temperature 98.6 F (37 C), temperature source Oral, resp. rate 16, height 5\' 4"  (1.626 m), weight 62.6 kg, SpO2 96 %. Vital signs in last 24 hours: Temp:  [97.6 F (36.4 C)-98.6 F (37 C)] 98.6 F (37 C) (10/21 [id number]) Pulse Rate:  [72-110] 86  (10/21 0812) Resp:  [13-20] 16 (10/21 0812) BP: (93-156)/(45-115) 105/47 (10/21 0812) SpO2:  [94 %-100 %] 96 % (10/21 0812)  Intake/Output from previous day: 10/20 0701 - 10/21 0700 In: 1259.2 [P.O.:240; I.V.:1003.5; IV Piggyback:15.8] Out: 50 [Blood:50]  LABS Recent Labs (last 2 labs)      Recent Labs    04/08/19 0624  HGB 8.9*     Recent Labs (last 2 labs)      Recent Labs    04/08/19 0624  WBC 6.4  RBC 3.65*  HCT 29.5*  PLT 460*     Recent Labs (last 2 labs)      Recent Labs    04/08/19 0624  NA 137  K 3.9  CL 99  CO2 27  BUN 27*  CREATININE 0.77  GLUCOSE 78  CALCIUM 9.2     Recent Labs (last 2 labs)      Recent Labs    04/08/19 0624  INR 1.1       Physical Exam RUE Splint in place             Sens  R/M/U still absent from the block             Mot   Ax/ R/ PIN/ M/ AIN/ U intact             Brisk CR, Rad 2+  Assessment/Plan: 1 Day Post-Op Procedure(s) (LRB): OPEN REDUCTION INTERNAL FIXATION  HUMERAL SHAFT NONUNION (Right) 1. PT/OT  2. DVT proph Other (comment) N/A 3. F/u 8-14 days  Altamese [place], MD [facility], [facility] [phone] (p)   Disposition:    Allergies as of 04/09/2019      Reactions   Morphine And Related Other (See Comments)   Knocked her out/ to strong   Amoxicillin Rash   Has patient had a PCN reaction causing immediate rash, facial/tongue/throat swelling, SOB or lightheadedness with hypotension: Yes Has patient had a PCN reaction causing severe rash involving mucus membranes or skin necrosis: No Has patient had a PCN reaction that required hospitalization: Yes Has patient had a PCN reaction occurring within the last 10 years: No If all of the above answers are "NO", then may proceed with Cephalosporin use.   Hydrocodone Rash      Medication List    STOP taking these medications   ibuprofen 200 MG tablet Commonly known as: ADVIL   meloxicam 7.5 MG tablet Commonly known  as: MOBIC     TAKE these medications   acetaminophen 500 MG tablet Commonly known as: TYLENOL Take 2 tablets (1,000 mg total) by mouth every 8 (eight) hours.   ascorbic acid 500 MG tablet Commonly known as: VITAMIN C Take 1 tablet (500 mg total) by mouth daily.   docusate sodium 100 MG capsule Commonly known as: COLACE Take 1 capsule (100 mg total) by mouth 2 (two) times daily.   ferrous sulfate 325 (65 FE) MG tablet Take 325 mg by mouth daily.   LEVEMIR FLEXTOUCH [place] Inject 30 Units into the skin every evening.   metFORMIN 1000 MG tablet  Commonly known as: GLUCOPHAGE Take 1,000 mg by mouth 2 (two) times daily with a meal.   methocarbamol 500 MG tablet Commonly known as: ROBAXIN Take 1 tablet (500 mg total) by mouth every 6 (six) hours as needed for muscle spasms.   omeprazole 20 MG capsule Commonly known as: PRILOSEC Take 20 mg by mouth daily.   PARoxetine 20 MG tablet Commonly known as: PAXIL Take 20 mg by mouth daily.   rosuvastatin 10 MG tablet Commonly known as: CRESTOR Take 10 mg by mouth daily.   sitaGLIPtin 100 MG tablet Commonly known as: JANUVIA Take 100 mg by mouth daily.   traMADol 50 MG tablet Commonly known as: ULTRAM Take 1-2 tablets (50-100 mg total) by mouth every 8 (eight) hours as needed for moderate pain.   Vitamin D3 25 MCG tablet Commonly known as: Vitamin D Take 2 tablets (2,000 Units total) by mouth 2 (two) times daily.      Follow-up Information    Altamese [place], MD. Schedule an appointment as soon as possible for a visit on 04/23/2019.   Specialty: Orthopedic Surgery Contact information: [place] [address] [phone]           Discharge Instructions and Plan:  Patient has a complex issue but we are hopeful that surgical intervention will lead to union of her right humeral shaft nonunion.  We did get excellent debridement of the nonunion site and are able to place adequate amount of graft which  should promote a favorable environment for union of her nonunion.  We also were able to obtain excellent stability with plate osteosynthesis which will help from a biomechanics standpoint.  She does need to maintain tight control of her blood glucose levels.  Ideally aiming for levels consistently under 200 to create a favorable environment for healing.  We will also supplement her vitamin D as she is vitamin D deficient this will also aid in her healing process.  Patient will refrain from lifting anything heavier than 5 pounds.  She will refrain from active shoulder abduction but has unrestricted range of motion of her right upper extremity otherwise.  She can be out of her sling when she is not mobilizing.  Dressing changes can begin when she discharges to home Comprehensive wound care instructions have been included in her discharge paperwork and have also been reviewed with the patient  She will follow-up with orthopedics in 10 to 14 days for suture removal and follow-up x-rays.  She is encouraged to call the office if she has any questions or concerns.  She is aware of signs and symptoms that would necessitate follow-up phone call or evaluation in the office or emergency department  Signed:  Jari Pigg, PA-C [phone] (C) 04/09/2019, 9:38 AM  [facility] [place] [address] [phone] Domingo Sep (F)

## 2019-04-09 NOTE — Evaluation (Signed)
Occupational Therapy Evaluation and Discharge Patient Details Name: Yvette Mcconnell MRN: [id number] DOB: 10/07/1950 Today's Date: 04/09/2019    History of Present Illness Pt is a 68 year old woman who fell at work in July resulting in R humerus fx which was treated conservatively. 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 PMH: DM, arthritis, breast cancer, depression.   Clinical Impression   Pt was modified independent in ADL since her fall in July. She denies pain, nerve block still partially intact. Pt educated in R UE exercises as per MD parameters, NWB precaution, compensatory strategies for ADL and sling use. Pt verbalized and/or demonstrated understanding.     Follow Up Recommendations  Follow surgeon's recommendation for DC plan and follow-up therapies    Equipment Recommendations  Tub/shower seat    Recommendations for Other Services       Precautions / Restrictions Precautions Precautions: Shoulder Type of Shoulder Precautions: pendulums, PROM FF to 90, ER to 30, no active ABD, AROM elbow to hand Shoulder Interventions: Shoulder sling/immobilizer;Off for dressing/bathing/exercises Precaution Booklet Issued: Yes (comment) Required Braces or Orthoses: Sling Restrictions Weight Bearing Restrictions: Yes RUE Weight Bearing: Non weight bearing      Mobility Bed Mobility Overal bed mobility: Modified Independent             General bed mobility comments: instructed pt to get up to L side of bed to avoid pushing up on R UE  Transfers Overall transfer level: Independent                    Balance                                           ADL either performed or assessed with clinical judgement   ADL Overall ADL's : Modified independent                                       General ADL Comments: Reviewed compensatory strategies for ADL, positioning R UE in bed and chair, sling use and wear schedule and NWB precaution      Vision Patient Visual Report: No change from baseline       Perception     Praxis      Pertinent Vitals/Pain Pain Assessment: No/denies pain     Hand Dominance Right   Extremity/Trunk Assessment Upper Extremity Assessment Upper Extremity Assessment: RUE deficits/detail RUE Deficits / Details: performed PROM as per MD parameters, AROM elbow to hand and pendulum exercises RUE Coordination: decreased gross motor   Lower Extremity Assessment Lower Extremity Assessment: Overall WFL for tasks assessed   Cervical / Trunk Assessment Cervical / Trunk Assessment: Normal   Communication Communication Communication: No difficulties   Cognition Arousal/Alertness: Awake/alert Behavior During Therapy: WFL for tasks assessed/performed Overall Cognitive Status: Within Functional Limits for tasks assessed                                     General Comments       Exercises     Shoulder Instructions      Home Living Family/patient expects to be discharged to:: Private residence Living Arrangements: Alone Available Help at Discharge: Family;Available 24 hours/day(brothers)  Additional Comments: workers comp Tourist information centre manager is ordering a Civil engineer, contracting      Prior Functioning/Environment Level of Independence: Independent                 OT Problem List:        OT Treatment/Interventions:      OT Goals(Current goals can be found in the care plan section) Acute Rehab OT Goals Patient Stated Goal: to regain use of R arm  OT Frequency:     Barriers to D/C:            Co-evaluation              AM-PAC OT "6 Clicks" Daily Activity     Outcome Measure Help from another person eating meals?: None Help from another person taking care of personal grooming?: None Help from another person toileting, which includes using toliet, bedpan, or urinal?: None Help from another person bathing (including washing,  rinsing, drying)?: None Help from another person to put on and taking off regular upper body clothing?: None Help from another person to put on and taking off regular lower body clothing?: None 6 Click Score: 24   End of Session    Activity Tolerance: Patient tolerated treatment well Patient left: in chair;with call bell/phone within reach  OT Visit Diagnosis: Muscle weakness (generalized) (M62.81)                Time: [id number] OT Time Calculation (min): 36 min Charges:  OT General Charges $OT Visit: 1 Visit OT Evaluation $OT Eval Low Complexity: 1 Low OT Treatments $Therapeutic Exercise: 8-22 mins  Yvette Mcconnell 04/09/2019, 9:55 AM  Nestor Lewandowsky, OTR/L Acute Rehabilitation Services Pager: [phone] Office: [phone]

## 2019-04-09 NOTE — Progress Notes (Signed)
PT Cancellation Note  Patient Details Name: Yvette Mcconnell MRN: [id number] DOB: Oct 27, 1950   Cancelled Treatment:    Reason Eval/Treat Not Completed: PT screened, no needs identified, will sign off. Pt was evaluated by OT with no acute PT needs identified at this time.   [place] 04/09/2019, 11:07 AM

## 2019-04-09 NOTE — Progress Notes (Signed)
Yvette Mcconnell to be D/C'd home per MD order.  Discussed prescriptions and follow up appointments with the patient. Prescriptions given to [facility], medication list explained in detail. Pt verbalized understanding.  Allergies as of 04/09/2019      Reactions   Morphine And Related Other (See Comments)   Knocked her out/ to strong   Amoxicillin Rash   Has patient had a PCN reaction causing immediate rash, facial/tongue/throat swelling, SOB or lightheadedness with hypotension: Yes Has patient had a PCN reaction causing severe rash involving mucus membranes or skin necrosis: No Has patient had a PCN reaction that required hospitalization: Yes Has patient had a PCN reaction occurring within the last 10 years: No If all of the above answers are "NO", then may proceed with Cephalosporin use.   Hydrocodone Rash      Medication List    STOP taking these medications   ibuprofen 200 MG tablet Commonly known as: ADVIL   meloxicam 7.5 MG tablet Commonly known as: MOBIC     TAKE these medications   acetaminophen 500 MG tablet Commonly known as: TYLENOL Take 2 tablets (1,000 mg total) by mouth every 8 (eight) hours.   ascorbic acid 500 MG tablet Commonly known as: VITAMIN C Take 1 tablet (500 mg total) by mouth daily.   docusate sodium 100 MG capsule Commonly known as: COLACE Take 1 capsule (100 mg total) by mouth 2 (two) times daily.   ferrous sulfate 325 (65 FE) MG tablet Take 325 mg by mouth daily.   LEVEMIR FLEXTOUCH [place] Inject 30 Units into the skin every evening.   metFORMIN 1000 MG tablet Commonly known as: GLUCOPHAGE Take 1,000 mg by mouth 2 (two) times daily with a meal.   methocarbamol 500 MG tablet Commonly known as: ROBAXIN Take 1 tablet (500 mg total) by mouth every 6 (six) hours as needed for muscle spasms.   omeprazole 20 MG capsule Commonly known as: PRILOSEC Take 20 mg by mouth daily.   PARoxetine 20 MG tablet Commonly known as:  PAXIL Take 20 mg by mouth daily.   rosuvastatin 10 MG tablet Commonly known as: CRESTOR Take 10 mg by mouth daily.   sitaGLIPtin 100 MG tablet Commonly known as: JANUVIA Take 100 mg by mouth daily.   traMADol 50 MG tablet Commonly known as: ULTRAM Take 1-2 tablets (50-100 mg total) by mouth every 8 (eight) hours as needed for moderate pain.   Vitamin D3 25 MCG tablet Commonly known as: Vitamin D Take 2 tablets (2,000 Units total) by mouth 2 (two) times daily.            Discharge Care Instructions  (From admission, onward)         Start     Ordered   04/09/19 0000  Non weight bearing     04/09/19 1005          Vitals:   04/09/19 0328 04/09/19 0812  BP: 118/65 (!) 105/47  Pulse: 91 86  Resp: 17 16  Temp: 98.5 F (36.9 C) 98.6 F (37 C)  SpO2: 100% 96%    Skin clean, dry and intact without evidence of skin break down, aside from surgical incision to right arm, dressing intact, with old drainage marked. IV catheter discontinued intact. Site without signs and symptoms of complications. Dressing and pressure applied. Pt denies pain at this time. No complaints noted.  An After Visit Summary was printed and given to the patient. Patient escorted via [place], and D/C home  via private auto with friend.  Velna Ochs 04/09/2019 12:36 PM

## 2019-04-09 NOTE — Plan of Care (Signed)

## 2019-04-09 NOTE — Progress Notes (Signed)
Pt presented for ORIF 2/2 R humerous fx.  - S/p repair of R humerous fx, 04/08/2019 Pt will d/c to home today with family. Workman's Compensation Carrier: [employer], [phone]. [place], [phone] (c), fax [phone].NCM spoke with Altha Harm and made her aware of d/c plan. Christine asked if we would inform pt shower chair has been ordered and will be delivered to pt's home today, NCM made pt aware. NCM faxed discharge summary to pt's CM as requested. Pt states has transportation to home. Whitman Hero RN,BSN,CM

## 2019-04-10 LAB — CALCITRIOL (1,25 DI-OH VIT D): Vit D, 1,25-Dihydroxy: 27.7 pg/mL (ref 19.9–79.3)

## 2019-04-14 ENCOUNTER — Encounter (HOSPITAL_COMMUNITY): Payer: Self-pay | Admitting: Orthopedic Surgery

## 2019-06-06 ENCOUNTER — Emergency Department (HOSPITAL_COMMUNITY): Payer: Medicare PPO

## 2019-06-06 ENCOUNTER — Emergency Department (HOSPITAL_COMMUNITY)
Admission: EM | Admit: 2019-06-06 | Discharge: 2019-06-06 | Disposition: A | Discharge status: Home / Self Care | Payer: Medicare PPO | Attending: Emergency Medicine | Admitting: Emergency Medicine

## 2019-06-06 ENCOUNTER — Encounter (HOSPITAL_COMMUNITY): Payer: Self-pay | Admitting: Emergency Medicine

## 2019-06-06 ENCOUNTER — Other Ambulatory Visit: Payer: Self-pay

## 2019-06-06 DIAGNOSIS — Z853 Personal history of malignant neoplasm of breast: Secondary | ICD-10-CM | POA: Insufficient documentation

## 2019-06-06 DIAGNOSIS — J45909 Unspecified asthma, uncomplicated: Secondary | ICD-10-CM | POA: Diagnosis not present

## 2019-06-06 DIAGNOSIS — Z9012 Acquired absence of left breast and nipple: Secondary | ICD-10-CM | POA: Insufficient documentation

## 2019-06-06 DIAGNOSIS — E119 Type 2 diabetes mellitus without complications: Secondary | ICD-10-CM | POA: Diagnosis not present

## 2019-06-06 DIAGNOSIS — W01190A Fall on same level from slipping, tripping and stumbling with subsequent striking against furniture, initial encounter: Secondary | ICD-10-CM | POA: Insufficient documentation

## 2019-06-06 DIAGNOSIS — S42301S Unspecified fracture of shaft of humerus, right arm, sequela: Secondary | ICD-10-CM | POA: Insufficient documentation

## 2019-06-06 DIAGNOSIS — M25511 Pain in right shoulder: Secondary | ICD-10-CM | POA: Diagnosis present

## 2019-06-06 NOTE — ED Triage Notes (Signed)
Patient had a plate placement in her R humerus 6-7 weeks ago at [facility] after a fall. Patient tripped and fell again today striking her arm on a coffee table. Denies striking her head, denies LOC.

## 2019-06-06 NOTE — ED Provider Notes (Signed)
[facility] Emergency Department Provider Note MRN:  [id number]  Arrival date & time: 06/06/19     Chief Complaint   Fall   History of Present Illness   Yvette Mcconnell is a 68 y.o. year-old female with a history of diabetes presenting to the ED with chief complaint of fall.  Patient explains that she broke her arm and needed surgery back in October.  She tripped and fell earlier today, hit her arm against a coffee table.  Had significant pain to her right arm immediately after the fall, improving with time.  Denies head trauma, no loss of consciousness, no chest pain or shortness of breath, no abdominal pain, no dizziness, no other injuries.  Review of Systems  A complete 10 system review of systems was obtained and all systems are negative except as noted in the HPI and PMH.   Patient's Health History    Past Medical History:  Diagnosis Date  . Anemia   . Arthritis   . Asthma    as a child, no problems as an adult, no inhalers  . Cancer [facility])    Breast Cancer  . Depression   . Diabetes mellitus without complication ([place])    type 2  . GERD (gastroesophageal reflux disease)   . Hyperlipidemia   . Pneumonia    x 1 years ago  . Vitamin D deficiency 04/09/2019    Past Surgical History:  Procedure Laterality Date  . ABDOMINAL HYSTERECTOMY    . APPENDECTOMY    . BONE BIOPSY Left 03/13/2018   Procedure: BONE BIOPSY 1ST METATARSAL HEAD LEFT FOOT;  Surgeon: Tyson Babinski, DPM;  Location: [facility];  Service: Podiatry;  Laterality: Left;  . COLONOSCOPY    . FOOT SURGERY    . INCISION AND DRAINAGE Left 03/13/2018   Procedure: INCISION AND DRAINAGE LEFT FOOT ULCER;  Surgeon: Tyson Babinski, DPM;  Location: [facility];  Service: Podiatry;  Laterality: Left;  Marland Kitchen MASTECTOMY Left   . ORIF HUMERUS FRACTURE Right 04/08/2019   Procedure: OPEN REDUCTION INTERNAL FIXATION  HUMERAL SHAFT NONUNION;  Surgeon: Altamese [place], MD;  Location: [place];  Service:  Orthopedics;  Laterality: Right;  . rotator cuff repair Left   . SESAMOIDECTOMY Left 03/13/2018   Procedure: TIBIAL AND FIBULAR SESAMOIDECTOMY LEFT FOOT;  Surgeon: Tyson Babinski, DPM;  Location: [facility];  Service: Podiatry;  Laterality: Left;  . TONSILLECTOMY    . UPPER GI ENDOSCOPY      Family History  Problem Relation Age of Onset  . Stroke Mother   . Diabetes Father     Social History   Socioeconomic History  . Marital status: Divorced    Spouse name: Not on file  . Number of children: Not on file  . Years of education: Not on file  . Highest education level: Not on file  Occupational History  . Not on file  Tobacco Use  . Smoking status: Never Smoker  . Smokeless tobacco: Never Used  Substance and Sexual Activity  . Alcohol use: Never  . Drug use: Never  . Sexual activity: Not on file    Comment: Hysterectomy  Other Topics Concern  . Not on file  Social History Narrative  . Not on file   Social Determinants of Health   Financial Resource Strain:   . Difficulty of Paying Living Expenses: Not on file  Food Insecurity:   . Worried About Charity fundraiser in the Last Year: Not on file  . Ran  Out of Food in the Last Year: Not on file  Transportation Needs:   . Lack of Transportation (Medical): Not on file  . Lack of Transportation (Non-Medical): Not on file  Physical Activity:   . Days of Exercise per Week: Not on file  . Minutes of Exercise per Session: Not on file  Stress:   . Feeling of Stress : Not on file  Social Connections:   . Frequency of Communication with Friends and Family: Not on file  . Frequency of Social Gatherings with Friends and Family: Not on file  . Attends Religious Services: Not on file  . Active Member of Clubs or Organizations: Not on file  . Attends Archivist Meetings: Not on file  . Marital Status: Not on file  Intimate Partner Violence:   . Fear of Current or Ex-Partner: Not on file  . Emotionally Abused: Not on  file  . Physically Abused: Not on file  . Sexually Abused: Not on file     Physical Exam  Vital Signs and Nursing Notes reviewed Vitals:   06/06/19 1708  BP: (!) 149/69  Pulse: 97  Resp: 16  Temp: 97.6 F (36.4 C)  SpO2: 94%    CONSTITUTIONAL: Well-appearing, NAD NEURO:  Alert and oriented x 3, no focal deficits EYES:  eyes equal and reactive ENT/NECK:  no LAD, no JVD CARDIO: Regular rate, well-perfused, normal S1 and S2 PULM:  CTAB no wheezing or rhonchi GI/GU:  normal bowel sounds, non-distended, non-tender MSK/SPINE:  No gross deformities, no edema; tenderness palpation to the right humerus, neurovascularly intact distally SKIN:  no rash, atraumatic PSYCH:  Appropriate speech and behavior  Diagnostic and Interventional Summary    EKG Interpretation  Date/Time:    Ventricular Rate:    PR Interval:    QRS Duration:   QT Interval:    QTC Calculation:   R Axis:     Text Interpretation:        Labs Reviewed - No data to display  DG Humerus Right    (Results Pending)    Medications - No data to display   Procedures  /  Critical Care Procedures  ED Course and Medical Decision Making  I have reviewed the triage vital signs and the nursing notes.  Pertinent labs & imaging results that were available during my care of the patient were reviewed by me and considered in my medical decision making (see below for details).     Fall onto recently surgically repaired humerus, x-ray reveals continued nonunion, new screw fractures.  Patient is neurovascularly intact, soft compartments, patient already has follow-up established on Monday with her surgeon.  Message left for her surgeon so that he is aware of this new complication.  No head trauma, no loss consciousness, no other injuries, appropriate for discharge.    Barth Kirks. Sedonia Small, [place] [email or web address]  Final Clinical Impressions(s) / ED Diagnoses      ICD-10-CM   1. Closed fracture of shaft of right humerus, unspecified fracture morphology, sequela  S42.301S     ED Discharge Orders    None       Discharge Instructions Discussed with and Provided to Patient:     Discharge Instructions     You were evaluated in the Emergency Department and after careful evaluation, we did not find any emergent condition requiring admission or further testing in the hospital.  Your exam/testing today is overall reassuring.  Please use Tylenol at  home for pain and follow-up with Dr. Ginette Pitman on Monday.  Please return to the Emergency Department if you experience any worsening of your condition.  We encourage you to follow up with a primary care provider.  Thank you for allowing [place] to be a part of your care.       Maudie Flakes, MD 06/06/19 2212

## 2019-06-06 NOTE — Discharge Instructions (Addendum)
You were evaluated in the Emergency Department and after careful evaluation, we did not find any emergent condition requiring admission or further testing in the hospital.  Your exam/testing today is overall reassuring.  Please use Tylenol at home for pain and follow-up with Dr. Ginette Pitman on Monday.  Please return to the Emergency Department if you experience any worsening of your condition.  We encourage you to follow up with a primary care provider.  Thank you for allowing [place] to be a part of your care.

## 2020-10-13 ENCOUNTER — Other Ambulatory Visit: Payer: Self-pay | Admitting: Podiatry

## 2020-10-13 DIAGNOSIS — M79675 Pain in left toe(s): Secondary | ICD-10-CM

## 2020-10-28 NOTE — Patient Instructions (Addendum)
Yvette Mcconnell  10/28/2020     @PREFPERIOPPHARMACY @   Your procedure is scheduled on 11/03/2020.   Report to [facility] at 6:15 A.M.   Call this number if you have problems the morning of surgery:  [phone]   Remember:  Do not eat or drink after midnight.      Take these medicines the morning of surgery with A SIP OF WATER :  Buspar, lexapro, gabapentin, omrprazole, ditropan, requip.  DO NOT take any medications for diabetes the morning of your procedure.  If your glucose is 70 or below the morning of your procedure, drink 1/2 cup of clear liquid containing sugar and recheck your glucose in 15 minutes. If your glucose is still 70 or below, call [phone] for instructions.  If your glucose is 300 or above the morning of your procedure, call [phone] for instructions.     Do not wear jewelry, make-up or nail polish.  Do not wear lotions, powders, or perfumes, or deodorant.  Do not shave 48 hours prior to surgery.  Men may shave face and neck.  Do not bring valuables to the hospital.  [facility] is not responsible for any belongings or valuables.  Contacts, dentures or bridgework may not be worn into surgery.  Leave your suitcase in the car.  After surgery it may be brought to your room.  For patients admitted to the hospital, discharge time will be determined by your treatment team.  Patients discharged the day of surgery will not be allowed to drive home.   Name and phone number of your driver:   family Special instructions:  n/a  Please read over the following fact sheets that you were given. Care and Recovery After Surgery   Please use the Hibiclens Wash the night before surgery and the morning of surgery.   Do not use it on your face, hair or genital area.   Please change the sheets on your bed the night before surgery and do not sleep with pets.    Bunion Surgery  Bunion surgery is done to remove a bunion, which is a bump that forms slowly on the  inner side of the big toe joint. A bunion can develop over time when pressure turns the big toe toward the second toe. Bunions may be caused by wearing shoes that are narrow or pointed. They can also be caused by certain conditions, such as rheumatoid arthritis and flat feet. You may need bunion surgery if your bunion is very large or painful or it affects your ability to walk. Tell a health care provider about:  Any allergies you have.  All medicines you are taking, including vitamins, herbs, eye drops, creams, and over-the-counter medicines.  Any problems you or family members have had with anesthetic medicines.  Any blood disorders you have.  Any surgeries you have had.  Any medical conditions you have.  Whether you are pregnant or may be pregnant. What are the risks? Generally, this is a safe procedure. However, problems may occur, including:  Infection.  Bleeding or blood clots.  Allergic reactions to medicines.  Nerve damage.  Pain, numbness, stiffness, or arthritis in the toe.  Return of the bunion.  Failure of the bone to heal completely after surgery, or failure of the surgery to relieve symptoms. What happens before the procedure? Staying hydrated Follow instructions from your health care provider about hydration, which may include:  Up to 2 hours before the procedure - you may continue to drink  clear liquids, such as water, clear fruit juice, black coffee, and plain tea.   Eating and drinking restrictions  Follow instructions from your health care provider about eating and drinking, which may include: ? 8 hours before the procedure - stop eating heavy meals or foods, such as meat, fried foods, or fatty foods. ? 6 hours before the procedure - stop eating light meals or foods, such as toast or cereal. ? 6 hours before the procedure - stop drinking milk or drinks that contain milk. ? 2 hours before the procedure - stop drinking clear liquids.  Do not drink  alcohol for the length of time you are told by your health care provider. Medicines Ask your health care provider about:  Changing or stopping your regular medicines. This is especially important if you are taking diabetes medicines or blood thinners.  Taking medicines such as aspirin and ibuprofen. These medicines can thin your blood. Do not take these medicines unless your health care provider tells you to take them.  Taking over-the-counter medicines, vitamins, herbs, and supplements. General instructions  Do not use any products that contain nicotine or tobacco for at least 4 weeks before the procedure. These products include cigarettes, e-cigarettes, and chewing tobacco. If you need help quitting, ask your health care provider.  Plan to have a responsible adult take you home from the hospital or clinic.  If you will be going home right after the procedure, plan to have a responsible adult care for you for the time you are told. This is important.  Ask your health care provider: ? How your surgery site will be marked. ? What steps will be taken to help prevent infection. These steps may include:  Washing skin with a germ-killing soap.  Taking antibiotic medicine. What happens during the procedure?  An IV will be inserted into one of your veins.  You will be given one or more of the following: ? A medicine to help you relax (sedative). ? A medicine to numb the area (local anesthetic). ? A medicine that is injected into an area of your body to numb everything below the injection site (regional anesthetic). ? A medicine to make you fall asleep (general anesthetic).  An incision will be made over the bump at the big toe joint. Depending on the type of procedure that is needed for your bunion, your surgeon may do one or more of the following: ? Exostectomy. This is a procedure to remove the bunion. ? Osteotomy. This is a procedure to cut and realign the damaged joint in your big  toe. ? Arthrodesis. For the procedure, hardware, such as screws, is used to keep your foot in the correct position. ? Tightening or loosening tissues around the big toe to reposition the toe.  The incision will be closed with stitches (sutures) and covered with adhesive strips or another type of bandage (dressing).  A supportive device, such as a boot, may be placed on your foot. A boot is a cast that you can walk on. The procedure may vary among health care providers and hospitals. What happens after the procedure?  Your blood pressure, heart rate, breathing rate, and blood oxygen level will be monitored until you leave the hospital or clinic.  If you were given a sedative during the procedure, it can affect you for several hours. Do not drive or operate machinery until your health care provider says that it is safe.  Ask your health care provider when it is safe to  drive if you have a boot or other supportive device on your foot.  You may be given instructions about weight-bearing restrictions. These instructions tell you how much body weight you can or cannot support on your foot. You may be given crutches, a cane, or a walker to help you move around so that you do not put (bear) weight on your foot. Summary  Bunion surgery is done to remove a bunion, which is a bump that forms slowly on the inner side of the big toe joint.  You may need bunion surgery if your bunion is very large or painful or it affects your ability to walk.  Before the procedure, follow instructions from your health care provider about eating and drinking, and ask about changing or stopping your regular medicines. This information is not intended to replace advice given to you by your health care provider. Make sure you discuss any questions you have with your health care provider. Document Revised: 10/10/2019 Document Reviewed: 10/10/2019 Elsevier Patient Education  2021 [place] After This sheet gives you information about how to care for yourself after your procedure. Your health care provider may also give you more specific instructions. If you have problems or questions, contact your health care provider. What can I expect after the procedure? After the procedure, it is common to have:  Tiredness.  Forgetfulness about what happened after the procedure.  Impaired judgment for important decisions.  Nausea or vomiting.  Some difficulty with balance. Follow these instructions at home: For the time period you were told by your health care provider:  Rest as needed.  Do not participate in activities where you could fall or become injured.  Do not drive or use machinery.  Do not drink alcohol.  Do not take sleeping pills or medicines that cause drowsiness.  Do not make important decisions or sign legal documents.  Do not take care of children on your own.      Eating and drinking  Follow the diet that is recommended by your health care provider.  Drink enough fluid to keep your urine pale yellow.  If you vomit: ? Drink water, juice, or soup when you can drink without vomiting. ? Make sure you have little or no nausea before eating solid foods. General instructions  Have a responsible adult stay with you for the time you are told. It is important to have someone help care for you until you are awake and alert.  Take over-the-counter and prescription medicines only as told by your health care provider.  If you have sleep apnea, surgery and certain medicines can increase your risk for breathing problems. Follow instructions from your health care provider about wearing your sleep device: ? Anytime you are sleeping, including during daytime naps. ? While taking prescription pain medicines, sleeping medicines, or medicines that make you drowsy.  Avoid smoking.  Keep all follow-up visits as told by your health care provider.  This is important. Contact a health care provider if:  You keep feeling nauseous or you keep vomiting.  You feel light-headed.  You are still sleepy or having trouble with balance after 24 hours.  You develop a rash.  You have a fever.  You have redness or swelling around the IV site. Get help right away if:  You have trouble breathing.  You have new-onset confusion at home. Summary  For several hours after your procedure, you  may feel tired. You may also be forgetful and have poor judgment.  Have a responsible adult stay with you for the time you are told. It is important to have someone help care for you until you are awake and alert.  Rest as told. Do not drive or operate machinery. Do not drink alcohol or take sleeping pills.  Get help right away if you have trouble breathing, or if you suddenly become confused. This information is not intended to replace advice given to you by your health care provider. Make sure you discuss any questions you have with your health care provider. Document Revised: 02/19/2020 Document Reviewed: 05/08/2019 Elsevier Patient Education  2021 [employer].

## 2020-11-01 ENCOUNTER — Encounter (HOSPITAL_COMMUNITY)
Admission: RE | Admit: 2020-11-01 | Discharge: 2020-11-01 | Disposition: A | Discharge status: Home / Self Care | Payer: Medicare HMO | Source: Ambulatory Visit | Attending: Podiatry | Admitting: Podiatry

## 2020-11-01 ENCOUNTER — Other Ambulatory Visit (HOSPITAL_COMMUNITY)
Admission: RE | Admit: 2020-11-01 | Discharge: 2020-11-01 | Disposition: A | Discharge status: Home / Self Care | Payer: Medicare HMO | Source: Ambulatory Visit | Attending: Podiatry | Admitting: Podiatry

## 2020-11-01 ENCOUNTER — Ambulatory Visit (HOSPITAL_COMMUNITY)
Admission: RE | Admit: 2020-11-01 | Discharge: 2020-11-01 | Disposition: A | Discharge status: Home / Self Care | Payer: Medicare HMO | Source: Ambulatory Visit | Attending: Podiatry | Admitting: Podiatry

## 2020-11-01 ENCOUNTER — Encounter (HOSPITAL_COMMUNITY): Payer: Self-pay

## 2020-11-01 ENCOUNTER — Other Ambulatory Visit: Payer: Self-pay

## 2020-11-01 DIAGNOSIS — Z01812 Encounter for preprocedural laboratory examination: Secondary | ICD-10-CM | POA: Insufficient documentation

## 2020-11-01 DIAGNOSIS — D860 Sarcoidosis of lung: Secondary | ICD-10-CM | POA: Insufficient documentation

## 2020-11-01 DIAGNOSIS — Z20822 Contact with and (suspected) exposure to covid-19: Secondary | ICD-10-CM | POA: Insufficient documentation

## 2020-11-01 DIAGNOSIS — M79675 Pain in left toe(s): Secondary | ICD-10-CM

## 2020-11-01 HISTORY — DX: Ataxia, unspecified: R27.0

## 2020-11-01 LAB — CBC WITH DIFFERENTIAL/PLATELET
Abs Immature Granulocytes: 0.02 10*3/uL (ref 0.00–0.07)
Basophils Absolute: 0 10*3/uL (ref 0.0–0.1)
Basophils Relative: 1 %
Eosinophils Absolute: 0.3 10*3/uL (ref 0.0–0.5)
Eosinophils Relative: 4 %
HCT: 41 % (ref 36.0–46.0)
Hemoglobin: 13.5 g/dL (ref 12.0–15.0)
Immature Granulocytes: 0 %
Lymphocytes Relative: 25 %
Lymphs Abs: 1.8 10*3/uL (ref 0.7–4.0)
MCH: 32 pg (ref 26.0–34.0)
MCHC: 32.9 g/dL (ref 30.0–36.0)
MCV: 97.2 fL (ref 80.0–100.0)
Monocytes Absolute: 0.6 10*3/uL (ref 0.1–1.0)
Monocytes Relative: 8 %
Neutro Abs: 4.5 10*3/uL (ref 1.7–7.7)
Neutrophils Relative %: 62 %
Platelets: 256 10*3/uL (ref 150–400)
RBC: 4.22 MIL/uL (ref 3.87–5.11)
RDW: 12.3 % (ref 11.5–15.5)
WBC: 7.2 10*3/uL (ref 4.0–10.5)
nRBC: 0 % (ref 0.0–0.2)

## 2020-11-01 LAB — BASIC METABOLIC PANEL
Anion gap: 8 (ref 5–15)
BUN: 31 mg/dL — ABNORMAL HIGH (ref 8–23)
CO2: 27 mmol/L (ref 22–32)
Calcium: 9.9 mg/dL (ref 8.9–10.3)
Chloride: 102 mmol/L (ref 98–111)
Creatinine, Ser: 1.07 mg/dL — ABNORMAL HIGH (ref 0.44–1.00)
GFR, Estimated: 56 mL/min — ABNORMAL LOW (ref >60)
Glucose, Bld: 161 mg/dL — ABNORMAL HIGH (ref 70–99)
Potassium: 4.4 mmol/L (ref 3.5–5.1)
Sodium: 137 mmol/L (ref 135–145)

## 2020-11-01 LAB — SARS CORONAVIRUS 2 (TAT 6-24 HRS): SARS Coronavirus 2: NEGATIVE

## 2020-11-01 LAB — HEMOGLOBIN A1C
Hgb A1c MFr Bld: 8.5 % — ABNORMAL HIGH (ref 4.8–5.6)
Mean Plasma Glucose: 197.25 mg/dL

## 2020-11-03 ENCOUNTER — Ambulatory Visit (HOSPITAL_COMMUNITY): Payer: Medicare HMO | Admitting: Certified Registered Nurse Anesthetist

## 2020-11-03 ENCOUNTER — Encounter (HOSPITAL_COMMUNITY): Payer: Self-pay

## 2020-11-03 ENCOUNTER — Ambulatory Visit (HOSPITAL_COMMUNITY): Payer: Medicare HMO

## 2020-11-03 ENCOUNTER — Ambulatory Visit (HOSPITAL_COMMUNITY)
Admission: RE | Admit: 2020-11-03 | Discharge: 2020-11-03 | Disposition: A | Discharge status: Home / Self Care | Payer: Medicare HMO | Attending: Podiatry | Admitting: Podiatry

## 2020-11-03 ENCOUNTER — Encounter (HOSPITAL_COMMUNITY)
Admission: RE | Disposition: A | Discharge status: Home / Self Care | Payer: Self-pay | Source: Home / Self Care | Attending: Podiatry

## 2020-11-03 DIAGNOSIS — Z880 Allergy status to penicillin: Secondary | ICD-10-CM | POA: Insufficient documentation

## 2020-11-03 DIAGNOSIS — L97522 Non-pressure chronic ulcer of other part of left foot with fat layer exposed: Secondary | ICD-10-CM | POA: Diagnosis not present

## 2020-11-03 DIAGNOSIS — M205X2 Other deformities of toe(s) (acquired), left foot: Secondary | ICD-10-CM | POA: Diagnosis not present

## 2020-11-03 DIAGNOSIS — Y939 Activity, unspecified: Secondary | ICD-10-CM | POA: Diagnosis not present

## 2020-11-03 DIAGNOSIS — G8929 Other chronic pain: Secondary | ICD-10-CM | POA: Diagnosis not present

## 2020-11-03 DIAGNOSIS — Z853 Personal history of malignant neoplasm of breast: Secondary | ICD-10-CM | POA: Insufficient documentation

## 2020-11-03 DIAGNOSIS — M19072 Primary osteoarthritis, left ankle and foot: Secondary | ICD-10-CM | POA: Diagnosis not present

## 2020-11-03 DIAGNOSIS — Z833 Family history of diabetes mellitus: Secondary | ICD-10-CM | POA: Insufficient documentation

## 2020-11-03 DIAGNOSIS — Z885 Allergy status to narcotic agent status: Secondary | ICD-10-CM | POA: Diagnosis not present

## 2020-11-03 DIAGNOSIS — K219 Gastro-esophageal reflux disease without esophagitis: Secondary | ICD-10-CM | POA: Diagnosis not present

## 2020-11-03 DIAGNOSIS — M79672 Pain in left foot: Secondary | ICD-10-CM | POA: Diagnosis not present

## 2020-11-03 DIAGNOSIS — Z823 Family history of stroke: Secondary | ICD-10-CM | POA: Insufficient documentation

## 2020-11-03 DIAGNOSIS — Z79899 Other long term (current) drug therapy: Secondary | ICD-10-CM | POA: Diagnosis not present

## 2020-11-03 DIAGNOSIS — M2042 Other hammer toe(s) (acquired), left foot: Secondary | ICD-10-CM | POA: Diagnosis not present

## 2020-11-03 DIAGNOSIS — E11621 Type 2 diabetes mellitus with foot ulcer: Secondary | ICD-10-CM | POA: Insufficient documentation

## 2020-11-03 DIAGNOSIS — S93122A Dislocation of metatarsophalangeal joint of left great toe, initial encounter: Secondary | ICD-10-CM | POA: Insufficient documentation

## 2020-11-03 DIAGNOSIS — X58XXXA Exposure to other specified factors, initial encounter: Secondary | ICD-10-CM | POA: Diagnosis not present

## 2020-11-03 DIAGNOSIS — Z791 Long term (current) use of non-steroidal anti-inflammatories (NSAID): Secondary | ICD-10-CM | POA: Diagnosis not present

## 2020-11-03 DIAGNOSIS — M21612 Bunion of left foot: Secondary | ICD-10-CM

## 2020-11-03 DIAGNOSIS — Z9889 Other specified postprocedural states: Secondary | ICD-10-CM

## 2020-11-03 HISTORY — PX: DISTAL INTERPHALANGEAL JOINT FUSION: SHX6428

## 2020-11-03 HISTORY — PX: BUNIONECTOMY: SHX129

## 2020-11-03 LAB — GLUCOSE, CAPILLARY
Glucose-Capillary: 146 mg/dL — ABNORMAL HIGH (ref 70–99)
Glucose-Capillary: 132 mg/dL — ABNORMAL HIGH (ref 70–99)

## 2020-11-03 SURGERY — DISTAL INTERPHALANGEAL JOINT FUSION
Anesthesia: General | Site: Toe | Laterality: Left

## 2020-11-03 MED ORDER — BUPIVACAINE HCL (PF) 0.5 % IJ SOLN
INTRAMUSCULAR | Status: AC
  Filled 2020-11-03: qty 30

## 2020-11-03 MED ORDER — CHLORHEXIDINE GLUCONATE 0.12 % MT SOLN
15.0000 mL | Freq: Once | OROMUCOSAL | Status: AC
  Administered 2020-11-03: 15 mL via OROMUCOSAL

## 2020-11-03 MED ORDER — ONDANSETRON HCL 4 MG/2ML IJ SOLN
INTRAMUSCULAR | Status: DC | PRN
  Administered 2020-11-03: 4 mg via INTRAVENOUS

## 2020-11-03 MED ORDER — LACTATED RINGERS IV SOLN
INTRAVENOUS | Status: DC
  Administered 2020-11-03: 1000 mL via INTRAVENOUS

## 2020-11-03 MED ORDER — CLINDAMYCIN PHOSPHATE 900 MG/50ML IV SOLN
INTRAVENOUS | Status: AC
  Filled 2020-11-03: qty 50

## 2020-11-03 MED ORDER — LIDOCAINE HCL (PF) 1 % IJ SOLN
INTRAMUSCULAR | Status: AC
  Filled 2020-11-03: qty 30

## 2020-11-03 MED ORDER — ORAL CARE MOUTH RINSE: 15.0000 mL | Freq: Once | OROMUCOSAL | Status: AC

## 2020-11-03 MED ORDER — MIDAZOLAM HCL 2 MG/2ML IJ SOLN
INTRAMUSCULAR | Status: DC | PRN
  Administered 2020-11-03: 1 mg via INTRAVENOUS

## 2020-11-03 MED ORDER — CLINDAMYCIN PHOSPHATE 600 MG/50ML IV SOLN
600.0000 mg | Freq: Once | INTRAVENOUS | Status: AC
  Administered 2020-11-03: 600 mg via INTRAVENOUS

## 2020-11-03 MED ORDER — FENTANYL CITRATE (PF) 100 MCG/2ML IJ SOLN
INTRAMUSCULAR | Status: AC
  Filled 2020-11-03: qty 2

## 2020-11-03 MED ORDER — PROPOFOL 10 MG/ML IV BOLUS
INTRAVENOUS | Status: DC | PRN
  Administered 2020-11-03: 150 mg via INTRAVENOUS

## 2020-11-03 MED ORDER — EPHEDRINE 5 MG/ML INJ
INTRAVENOUS | Status: AC
  Filled 2020-11-03: qty 10

## 2020-11-03 MED ORDER — CHLORHEXIDINE GLUCONATE CLOTH 2 % EX PADS: 6.0000 | MEDICATED_PAD | Freq: Once | CUTANEOUS | Status: DC

## 2020-11-03 MED ORDER — FENTANYL CITRATE (PF) 100 MCG/2ML IJ SOLN
INTRAMUSCULAR | Status: DC | PRN
  Administered 2020-11-03 (×6): 25 ug via INTRAVENOUS

## 2020-11-03 MED ORDER — 0.9 % SODIUM CHLORIDE (POUR BTL) OPTIME
TOPICAL | Status: DC | PRN
  Administered 2020-11-03: 1000 mL

## 2020-11-03 MED ORDER — MIDAZOLAM HCL 2 MG/2ML IJ SOLN
INTRAMUSCULAR | Status: AC
  Filled 2020-11-03: qty 2

## 2020-11-03 MED ORDER — LIDOCAINE HCL (CARDIAC) PF 100 MG/5ML IV SOSY
PREFILLED_SYRINGE | INTRAVENOUS | Status: DC | PRN
  Administered 2020-11-03: 40 mg via INTRATRACHEAL

## 2020-11-03 MED ORDER — LIDOCAINE HCL 1 % IJ SOLN
INTRAMUSCULAR | Status: DC | PRN
  Administered 2020-11-03: 20 mL via INTRADERMAL

## 2020-11-03 MED ORDER — SODIUM CHLORIDE FLUSH 0.9 % IV SOLN
INTRAVENOUS | Status: AC
  Filled 2020-11-03: qty 10

## 2020-11-03 MED ORDER — CLINDAMYCIN PHOSPHATE 600 MG/50ML IV SOLN
INTRAVENOUS | Status: AC
  Filled 2020-11-03: qty 50

## 2020-11-03 MED ORDER — FENTANYL CITRATE (PF) 100 MCG/2ML IJ SOLN: 25.0000 ug | INTRAMUSCULAR | Status: DC | PRN

## 2020-11-03 MED ORDER — EPHEDRINE SULFATE 50 MG/ML IJ SOLN
INTRAMUSCULAR | Status: DC | PRN
  Administered 2020-11-03 (×5): 10 mg via INTRAVENOUS

## 2020-11-03 MED ORDER — ONDANSETRON HCL 4 MG/2ML IJ SOLN
INTRAMUSCULAR | Status: AC
  Filled 2020-11-03: qty 2

## 2020-11-03 MED ORDER — CLINDAMYCIN PHOSPHATE 900 MG/50ML IV SOLN: 900.0000 mg | INTRAVENOUS | Status: DC

## 2020-11-03 MED ORDER — PROPOFOL 10 MG/ML IV BOLUS
INTRAVENOUS | Status: AC
  Filled 2020-11-03: qty 40

## 2020-11-03 MED ORDER — LIDOCAINE HCL (PF) 2 % IJ SOLN
INTRAMUSCULAR | Status: AC
  Filled 2020-11-03: qty 5

## 2020-11-03 SURGICAL SUPPLY — 63 items
APL PRP STRL LF DISP 70% ISPRP (MISCELLANEOUS) ×2
APL SKNCLS STERI-STRIP NONHPOA (GAUZE/BANDAGES/DRESSINGS) ×2
BANDAGE ESMARK 4X12 BL STRL LF (DISPOSABLE) ×2 IMPLANT
BENZOIN TINCTURE PRP APPL 2/3 (GAUZE/BANDAGES/DRESSINGS) ×3 IMPLANT
BIT DRILL 2.0X128 (BIT) ×3 IMPLANT
BLADE AVERAGE 25X9 (BLADE) ×3 IMPLANT
BLADE OSC/SAGITTAL MD 5.5X18 (BLADE) ×3 IMPLANT
BLADE SURG 15 STRL LF DISP TIS (BLADE) ×6 IMPLANT
BLADE SURG 15 STRL SS (BLADE) ×9
BNDG CMPR 12X4 ELC STRL LF (DISPOSABLE) ×2
BNDG CMPR STD VLCR NS LF 5.8X4 (GAUZE/BANDAGES/DRESSINGS) ×2
BNDG COHESIVE 4X5 TAN STRL (GAUZE/BANDAGES/DRESSINGS) ×3 IMPLANT
BNDG CONFORM 2 STRL LF (GAUZE/BANDAGES/DRESSINGS) ×3 IMPLANT
BNDG ELASTIC 4X5.8 VLCR NS LF (GAUZE/BANDAGES/DRESSINGS) ×3 IMPLANT
BNDG ESMARK 4X12 BLUE STRL LF (DISPOSABLE) ×3
BNDG GAUZE ELAST 4 BULKY (GAUZE/BANDAGES/DRESSINGS) ×3 IMPLANT
BOOT STEPPER DURA MED (SOFTGOODS) ×3 IMPLANT
CAP PIN PROTECTOR ORTHO WHT (CAP) ×3 IMPLANT
CHLORAPREP W/TINT 26 (MISCELLANEOUS) ×3 IMPLANT
CLIP EZ FIXATION 10X10X10 (Staple) ×6 IMPLANT
CLOSURE STERI-STRIP 1/4X4 (GAUZE/BANDAGES/DRESSINGS) ×3 IMPLANT
CLOTH BEACON ORANGE TIMEOUT ST (SAFETY) ×3 IMPLANT
COVER LIGHT HANDLE STERIS (MISCELLANEOUS) ×6 IMPLANT
COVER WAND RF STERILE (DRAPES) ×3 IMPLANT
CUFF TOURN SGL QUICK 18X4 (TOURNIQUET CUFF) ×3 IMPLANT
DECANTER SPIKE VIAL GLASS SM (MISCELLANEOUS) ×3 IMPLANT
DRAPE OEC MINIVIEW 54X84 (DRAPES) ×3 IMPLANT
DRILL BIT ALLOFIX 2.0MM (BIT) IMPLANT
DRSG ADAPTIC 3X8 NADH LF (GAUZE/BANDAGES/DRESSINGS) ×3 IMPLANT
ELECT REM PT RETURN 9FT ADLT (ELECTROSURGICAL) ×3
ELECTRODE REM PT RTRN 9FT ADLT (ELECTROSURGICAL) ×2 IMPLANT
GAUZE SPONGE 4X4 12PLY STRL (GAUZE/BANDAGES/DRESSINGS) ×3 IMPLANT
GLOVE SURG ENC MOIS LTX SZ7.5 (GLOVE) ×3 IMPLANT
GLOVE SURG LTX SZ7 (GLOVE) ×6 IMPLANT
GLOVE SURG UNDER POLY LF SZ7 (GLOVE) ×6 IMPLANT
GLOVE SURG UNDER POLY LF SZ7.5 (GLOVE) ×6 IMPLANT
GOWN STRL REUS W/ TWL LRG LVL3 (GOWN DISPOSABLE) ×2 IMPLANT
GOWN STRL REUS W/TWL LRG LVL3 (GOWN DISPOSABLE) ×9 IMPLANT
K-WIRE DBL END TROCAR 6X.062 (WIRE) ×6
K-WIRE SGL PT/SMTH 9X35 (WIRE) ×3
KIT TURNOVER KIT A (KITS) ×3 IMPLANT
KWIRE DBL END TROCAR 6X.062 (WIRE) ×4 IMPLANT
KWIRE SGL PT/SMTH 9X35 (WIRE) ×2 IMPLANT
MANIFOLD NEPTUNE II (INSTRUMENTS) ×3 IMPLANT
NEEDLE HYPO 18GX1.5 BLUNT FILL (NEEDLE) ×3 IMPLANT
NEEDLE HYPO 25X1 1.5 SAFETY (NEEDLE) ×15 IMPLANT
NS IRRIG 1000ML POUR BTL (IV SOLUTION) ×3 IMPLANT
PACK BASIC LIMB (CUSTOM PROCEDURE TRAY) ×3 IMPLANT
PAD ARMBOARD 7.5X6 YLW CONV (MISCELLANEOUS) ×3 IMPLANT
PAD TELFA 3X4 1S STER (GAUZE/BANDAGES/DRESSINGS) ×3 IMPLANT
RASP SM TEAR CROSS CUT (RASP) ×3 IMPLANT
SET BASIN LINEN APH (SET/KITS/TRAYS/PACK) ×3 IMPLANT
STRIP CLOSURE SKIN 1/2X4 (GAUZE/BANDAGES/DRESSINGS) ×3 IMPLANT
SUT MON AB 5-0 PS2 18 (SUTURE) ×6 IMPLANT
SUT PROLENE 4 0 PS 2 18 (SUTURE) ×3 IMPLANT
SUT VIC AB 2-0 CT2 27 (SUTURE) ×3 IMPLANT
SUT VIC AB 3-0 SH 27 (SUTURE) ×3
SUT VIC AB 3-0 SH 27X BRD (SUTURE) ×2 IMPLANT
SUT VIC AB 4-0 PS2 27 (SUTURE) ×6 IMPLANT
SUT VICRYL AB 3-0 FS1 BRD 27IN (SUTURE) ×3 IMPLANT
SYR BULB IRRIG 60ML STRL (SYRINGE) ×3 IMPLANT
SYR CONTROL 10ML LL (SYRINGE) ×9 IMPLANT
WATER STERILE IRR 1000ML POUR (IV SOLUTION) ×3 IMPLANT

## 2020-11-03 NOTE — Anesthesia Preprocedure Evaluation (Signed)
Anesthesia Evaluation  Patient identified by MRN, date of birth, ID band Patient awake    Reviewed: Allergy & Precautions, H&P , NPO status , Patient's Chart, lab work & pertinent test results, reviewed documented beta blocker date and time   Airway Mallampati: II  TM Distance: >3 FB Neck ROM: full    Dental no notable dental hx.    Pulmonary asthma ,    Pulmonary exam normal breath sounds clear to auscultation       Cardiovascular Exercise Tolerance: Good negative cardio ROS   Rhythm:regular Rate:Normal     Neuro/Psych PSYCHIATRIC DISORDERS Depression negative neurological ROS     GI/Hepatic Neg liver ROS, GERD  Medicated,  Endo/Other  negative endocrine ROSdiabetes, Poorly Controlled, Type 2  Renal/GU negative Renal ROS  negative genitourinary   Musculoskeletal   Abdominal   Peds  Hematology  (+) Blood dyscrasia, anemia ,   Anesthesia Other Findings   Reproductive/Obstetrics negative OB ROS                             Anesthesia Physical Anesthesia Plan  ASA: II  Anesthesia Plan: General and General LMA   Post-op Pain Management:    Induction:   PONV Risk Score and Plan: Ondansetron  Airway Management Planned:   Additional Equipment:   Intra-op Plan:   Post-operative Plan:   Informed Consent: I have reviewed the patients History and Physical, chart, labs and discussed the procedure including the risks, benefits and alternatives for the proposed anesthesia with the patient or authorized representative who has indicated his/her understanding and acceptance.     Dental Advisory Given  Plan Discussed with: CRNA  Anesthesia Plan Comments:         Anesthesia Quick Evaluation

## 2020-11-03 NOTE — Brief Op Note (Signed)
11/03/2020  9:44 AM  PATIENT:  Yvette Mcconnell  70 y.o. female  PRE-OPERATIVE DIAGNOSIS:  HALLUX LIMITUS, LEFT FOOT WITH DISLOCATION OF THE FIRST METATARSAL PHALANGEAL JOINT, CHRONIC ULCERATION OF LEFT HALLUX, HAMMERED HALLUX LEFT FOOT, LEFT FOOT PAIN  POST-OPERATIVE DIAGNOSIS:  Pre-op diagnosis: HALLUX LIMITUS, LEFT FOOT WITH DISLOCATION OF THE FIRST METATARSAL PHALANGEAL JOINT, CHRONIC ULCERATION OF LEFT HALLUX, HAMMERED HALLUX LEFT FOOT, LEFT FOOT PAIN  PROCEDURE:  Procedure(s): INTERPHALANGEAL JOINT FUSION OF LEFT HALLUX (Left) KELLER BUNIONECTOMY OF LEFT HALLUX (Left)  SURGEON:  Surgeon(s) and Role:    Tyson Babinski, DPM - Primary  ASSISTANTS: Dr.Benjamin McKinney.   ANESTHESIA:   local, IV sedation and MAC  EBL:  1 mL   BLOOD ADMINISTERED:none  DRAINS: none   LOCAL MEDICATIONS USED:  MARCAINE   , XYLOCAINE  and Amount: 10 m pre and 64m Post.   SPECIMEN:  Source of Specimen:  Bone of proximal phalanx left hallux.   DISPOSITION OF SPECIMEN:  PATHOLOGY  COUNTS:  YES  TOURNIQUET:   Total Tourniquet Time Documented: area (Left) - 96 minutes Total: area (Left) - 96 minutes   DICTATION: .DViviann SpareDictation  PLAN OF CARE: Discharge to home after PACU  PATIENT DISPOSITION:  PACU - hemodynamically stable.   Delay start of Pharmacological VTE agent (>24hrs) due to surgical blood loss or risk of bleeding: not applicable

## 2020-11-03 NOTE — Transfer of Care (Signed)
Immediate Anesthesia Transfer of Care Note  Patient: Yvette Mcconnell  Procedure(s) Performed: INTERPHALANGEAL JOINT FUSION OF LEFT HALLUX (Left Foot) KELLER BUNIONECTOMY OF LEFT HALLUX (Left Toe)  Patient Location: PACU  Anesthesia Type:General  Level of Consciousness: drowsy  Airway & Oxygen Therapy: Patient Spontanous Breathing and Patient connected to nasal cannula oxygen  Post-op Assessment: Report given to RN and Post -op Vital signs reviewed and stable  Post vital signs: Reviewed and stable  Last Vitals:  Vitals Value Taken Time  BP 103/49 11/03/20 0949  Temp 98.4   Pulse 74 11/03/20 0952  Resp 12 11/03/20 0952  SpO2 96 % 11/03/20 0952  Vitals shown include unvalidated device data.  Last Pain:  Vitals:   11/03/20 0648  TempSrc: Oral  PainSc: 0-No pain      Patients Stated Pain Goal: 5 (03/70/48 8891)  Complications: No complications documented.

## 2020-11-03 NOTE — Anesthesia Procedure Notes (Signed)
Procedure Name: LMA Insertion Date/Time: 11/03/2020 7:41 AM Performed by: Karna Dupes, CRNA Pre-anesthesia Checklist: Patient identified, Emergency Drugs available, Suction available and Patient being monitored Patient Re-evaluated:Patient Re-evaluated prior to induction Oxygen Delivery Method: Circle system utilized Preoxygenation: Pre-oxygenation with 100% oxygen Induction Type: IV induction LMA: LMA inserted LMA Size: 3.0 Tube secured with: Tape Dental Injury: Teeth and Oropharynx as per pre-operative assessment

## 2020-11-03 NOTE — Anesthesia Postprocedure Evaluation (Signed)
Anesthesia Post Note  Patient: Yvette Mcconnell  Procedure(s) Performed: INTERPHALANGEAL JOINT FUSION OF LEFT HALLUX (Left Foot) KELLER BUNIONECTOMY OF LEFT HALLUX (Left Toe)  Patient location during evaluation: Phase II Anesthesia Type: General Level of consciousness: awake Pain management: pain level controlled Vital Signs Assessment: post-procedure vital signs reviewed and stable Respiratory status: spontaneous breathing and respiratory function stable Cardiovascular status: blood pressure returned to baseline and stable Postop Assessment: no headache and no apparent nausea or vomiting Anesthetic complications: no Comments: Late entry   No complications documented.   Last Vitals:  Vitals:   11/03/20 1115 11/03/20 1148  BP: 133/69 125/77  Pulse: 82 77  Resp: 15 18  Temp:  36.9 C  SpO2: 98% 94%    Last Pain:  Vitals:   11/03/20 1148  TempSrc: Oral  PainSc: 0-No pain                 Louann Sjogren

## 2020-11-03 NOTE — Op Note (Signed)
11/03/2020  9:44 AM  PATIENT:  Yvette Mcconnell  70 y.o. female  PRE-OPERATIVE DIAGNOSIS:  HALLUX LIMITUS, LEFT FOOT WITH DISLOCATION OF THE FIRST METATARSAL PHALANGEAL JOINT, CHRONIC ULCERATION OF LEFT HALLUX, HAMMERED HALLUX LEFT FOOT, LEFT FOOT PAIN  POST-OPERATIVE DIAGNOSIS:  Pre-op diagnosis: HALLUX LIMITUS, LEFT FOOT WITH DISLOCATION OF THE FIRST METATARSAL PHALANGEAL JOINT, CHRONIC ULCERATION OF LEFT HALLUX, HAMMERED HALLUX LEFT FOOT, LEFT FOOT PAIN  PROCEDURE:  Procedure(s): INTERPHALANGEAL JOINT FUSION OF LEFT HALLUX (Left) KELLER BUNIONECTOMY OF LEFT HALLUX (Left)  SURGEON:  Surgeon(s) and Role:    Tyson Babinski, DPM - Primary  ASSISTANTS: Dr.Benjamin McKinney.   ANESTHESIA:   local, IV sedation and MAC  EBL:  1 mL   Materials: 10x10 ez clip staple x 2. 6'2 K wire, 2-0 Vicryl, 3-0 Vicryl, 4-0 Prolene.   LOCAL MEDICATIONS USED:  MARCAINE   , XYLOCAINE  and Amount: 10 m pre and 34m Post.   SPECIMEN:  Source of Specimen:  Bone of proximal phalanx left hallux.   DISPOSITION OF SPECIMEN:  PATHOLOGY  TOURNIQUET:   Total Tourniquet Time Documented: area (Left) - 96 minutes Total: area (Left) - 96 minutes  Patient was brought into the operating room laid supine on the operating table. Ankle tourniquet was applied to the surgical extremity. Following IV sedation, a local block was achieved using 10 cc of mixture of 1% plain lidocaine with 0.5% marcaine. The foot was the prepped, scrubbed and draped in aseptic manner. Using an esmarch band the tourniquet on the surgical site was inflatted at 2517mG.   Attention was directed towards the left foot. Left hallux is contracted at the MPJ and IPJ with non reducible deformity. There is chronic ulceration sub 1st met head plantar aspect left foot.   Attention was then directed to the dorsal aspect of the first metatarsophalangeal joint where a 7 cm linear incision was made medial and parallel to the course of the extensor  hallucis longus tendon. The incision was made from the IPJ and ended just proximal to the first metatarsal phalangeal joint.   The incision was deepened through subcutaneous tissues.  Vital neurovascular structures were identified and retracted.  All bleeders were identified and cauterized.  The incision was deepened to the level of the first metatarsophalangeal joint capsule. There was lot of adhesive capsule noted from previous bunion surgery. The base of the proximal phalanx was sitting on top of the first metatarsal head. At this time transverse tenotomy of the EHL tendon was done just proximal to the IPJ joint. The capsule was freed around the joint. The head of the proximal phalanx and base of the distal phalanx was resected using a sagittal saw. At this time attention was directed towards the first MPJ. The capsule around the MPJ was freed up as much possible. At this time using a sagittal saw the base of the proximal phalanx was resected. At this time mcglammarey elevator was used to free up some of the adhesive capsule around the first met head plantarly. There was ideal and enough reduction noted of the first MPJ. At this time power rasp was used to smooth out any sharp edges.  At this 6'2 K wire was inserted into the base of the proximal phalanx and driven across the IPJ. The k wire was out at the tip of the toe and retrograded back into the first metatarsal head. Fluoroscopy was used to confirm the position of the K wire. At this time two 10x10 staples were inserted  into the IPJ for fusion. Once again fluoroscopy was used to confirm the position of the staple. Adequate compression noted across the IPJ. The K wire was bent and cut at the tip of the toe. The incision site was irrigated with copious amount of saline. The EHL tendon was repaired using 2-0 Vicryl. The capsule was closed using 3-0 Vicryl. The sub cutaneous tissue was closed using 3-0 Vicryl. The skin was reapproximated using 4-0 Prolene. Dry  sterile dressing applied. Tourniquet was deflated. Capillary refill time was brisk to all lesser toes.   Patient will be placed in cam walker with limited ambulation.

## 2020-11-03 NOTE — Discharge Instructions (Signed)
.These instructions will give you an idea of what to expect after surgery and how to manage issues that may arise before your first post op office visit.  Pain Management Pain is best managed by "staying ahead" of it. If pain gets out of control, it is difficult to get it back under control. Local anesthesia that lasts 6-8 hours is used to numb the foot and decrease pain.  For the best pain control, take the pain medication every 4 hours for the first 2 days post op. On the third day pain medication can be taken as needed.   Post Op Nausea Nausea is common after surgery, so it is managed proactively.  If prescribed, use the prescribed nausea medication regularly for the first 2 days post op.  Bandages Do not worry if there is blood on the bandage. What looks like a lot of blood on the bandage is actually a small amount. Blood on the dressing spreads out as it is absorbed by the gauze, the same way a drop of water spreads out on a paper towel.  If the bandages feel wet or dry, stiff and uncomfortable, call the office during office hours and we will schedule a time for you to have the bandage changed.  Unless you are specifically told otherwise, we will do the first bandage change in the office.  Keep your bandage dry. If the bandage becomes wet or soiled, notify the office and we will schedule a time to change the bandage.  Activity It is best to spend most of the first 2 days after surgery lying down with the foot elevated above the level of your heart. You may put weight on your heel while wearing the surgical shoe.   You may only get up to go to the restroom.  Driving Do not drive until you are able to respond in an emergency (i.e. slam on the brakes). This usually occurs after the bone has healed - 6 to 8 weeks.  Call the Office If you have a fever over 101F.  If you have increasing pain after the initial post op pain has settled down.  If you have increasing redness, swelling, or  drainage.  If you have any questions or concerns.       General Anesthesia, Adult, Care After This sheet gives you information about how to care for yourself after your procedure. Your health care provider may also give you more specific instructions. If you have problems or questions, contact your health care provider. What can I expect after the procedure? After the procedure, the following side effects are common:  Pain or discomfort at the IV site.  Nausea.  Vomiting.  Sore throat.  Trouble concentrating.  Feeling cold or chills.  Feeling weak or tired.  Sleepiness and fatigue.  Soreness and body aches. These side effects can affect parts of the body that were not involved in surgery. Follow these instructions at home: For the time period you were told by your health care provider:  Rest.  Do not participate in activities where you could fall or become injured.  Do not drive or use machinery.  Do not drink alcohol.  Do not take sleeping pills or medicines that cause drowsiness.  Do not make important decisions or sign legal documents.  Do not take care of children on your own.   Eating and drinking  Follow any instructions from your health care provider about eating or drinking restrictions.  When you feel hungry, start by  eating small amounts of foods that are soft and easy to digest (bland), such as toast. Gradually return to your regular diet.  Drink enough fluid to keep your urine pale yellow.  If you vomit, rehydrate by drinking water, juice, or clear broth. General instructions  If you have sleep apnea, surgery and certain medicines can increase your risk for breathing problems. Follow instructions from your health care provider about wearing your sleep device: ? Anytime you are sleeping, including during daytime naps. ? While taking prescription pain medicines, sleeping medicines, or medicines that make you drowsy.  Have a responsible adult stay  with you for the time you are told. It is important to have someone help care for you until you are awake and alert.  Return to your normal activities as told by your health care provider. Ask your health care provider what activities are safe for you.  Take over-the-counter and prescription medicines only as told by your health care provider.  If you smoke, do not smoke without supervision.  Keep all follow-up visits as told by your health care provider. This is important. Contact a health care provider if:  You have nausea or vomiting that does not get better with medicine.  You cannot eat or drink without vomiting.  You have pain that does not get better with medicine.  You are unable to pass urine.  You develop a skin rash.  You have a fever.  You have redness around your IV site that gets worse. Get help right away if:  You have difficulty breathing.  You have chest pain.  You have blood in your urine or stool, or you vomit blood. Summary  After the procedure, it is common to have a sore throat or nausea. It is also common to feel tired.  Have a responsible adult stay with you for the time you are told. It is important to have someone help care for you until you are awake and alert.  When you feel hungry, start by eating small amounts of foods that are soft and easy to digest (bland), such as toast. Gradually return to your regular diet.  Drink enough fluid to keep your urine pale yellow.  Return to your normal activities as told by your health care provider. Ask your health care provider what activities are safe for you. This information is not intended to replace advice given to you by your health care provider. Make sure you discuss any questions you have with your health care provider. Document Revised: 02/19/2020 Document Reviewed: 09/18/2019 Elsevier Patient Education  2021 [employer].

## 2020-11-03 NOTE — H&P (Signed)
[facility] HISTORY AND PHYSICAL INTERVAL NOTE:  11/03/2020  7:12 AM  Yvette Mcconnell  has presented today for surgery, with the diagnosis of HALLUX LIMITUS, LEFT FOOT WITH DISLOCATION OF THE FIRST METATARSAL PHALANGEAL JOINT, CHRONIC ULCERATION OF LEFT HALLUX, HAMMERED HALLUX LEFT FOOT, LEFT FOOT PAIN.  The various methods of treatment have been discussed with the patient.  No guarantees were given.  After consideration of risks, benefits and other options for treatment, the patient has consented to surgery.  I have reviewed the patients' chart and labs.    Patient Vitals for the past 24 hrs:  BP Temp Temp src Resp SpO2  11/03/20 0648 137/74 97.8 F (36.6 C) Oral 17 100 %      /Contraindications         Allergies from outside sources have been reviewed for this encounter. Go Reconcile       Reaction Severity Reaction Type Noted Valid Until Updated           Allergies  Morphine And Related  Other (See Comments) Not Specified  04/04/2019  Past Updates...  Knocked her out/ to strong   Amoxicillin  Rash Low  03/07/2018  Past Updates...  Has patient had a PCN reaction causing immediate rash, facial/tongue/throat swelling, SOB or lightheadedness with hypotension: Yes Has patient had a PCN reaction causing severe rash involving mucus membranes or skin necrosis: No Has patient had a PCN reaction that required hospitalization: Yes Has patient had a PCN reaction occurring within the last 10 years: No If all of the above answers are "NO", then may proceed with Cephalosporin use.    Hydrocodone  Rash Low  03/07/2018  Past Updates...     History        Medical History Surgical History   Procedure Laterality Date Comment Source  Diagnosis Date Comment Source ABDOMINAL HYSTERECTOMY      Anemia    APPENDECTOMY      Arthritis    BONE BIOPSY Left 03/13/2018 Procedure: BONE BIOPSY 1ST METATARSAL HEAD LEFT FOOT; Surgeon: Tyson Babinski, DPM; Location: [facility]; Service: Podiatry; Laterality: Left;    Asthma  as a child, no problems as an adult, no inhalers  COLONOSCOPY      Ataxia, unspecified  cerebellum  FOOT SURGERY      Cancer ([place]) 2000 Breast Cancer  INCISION AND DRAINAGE Left 03/13/2018 Procedure: INCISION AND DRAINAGE LEFT FOOT ULCER; Surgeon: Tyson Babinski, DPM; Location: [facility]; Service: Podiatry; Laterality: Left;   Depression    MASTECTOMY Left     Diabetes mellitus without complication ([place])  type 2  ORIF HUMERUS FRACTURE Right 04/08/2019 Procedure: OPEN REDUCTION INTERNAL FIXATION HUMERAL SHAFT NONUNION; Surgeon: Altamese Quechee, MD; Location: [facility]; Service: Orthopedics; Laterality: Right;   GERD (gastroesophageal reflux disease)    rotator cuff repair Left     Hyperlipidemia    SESAMOIDECTOMY Left 03/13/2018 Procedure: TIBIAL AND FIBULAR SESAMOIDECTOMY LEFT FOOT; Surgeon: Tyson Babinski, DPM; Location: [facility]; Service: Podiatry; Laterality: Left;   Pneumonia  x 1 years ago  TONSILLECTOMY      Vitamin D deficiency 04/09/2019   UPPER GI ENDOSCOPY           Medical History - Pertinent Negatives  Family Status   Pertinent Negative Date Comment Source  Relation Status   Complication of anesthesia 03/11/2018    Mother    Family history of adverse reaction to anesthesia 03/11/2018    Father    PONV (postoperative nausea and vomiting) 04/07/2019  Family History      Problem Relation Age of Onset Comments      Diabetes Father        Stroke Mother           Tobacco Use Never smoked or used smokeless tobacco.  Vaping Use Never used   Alcohol Use Never.  Drug Use Never.  Sexual Activity Not asked if sexually active.  Comments: Hysterectomy   Medication Documentation Review Audit   Reviewed by Tyson Babinski, DPM (Physician) on 11/03/20 at [facility] List Status: Complete Medication Order Taking? Sig Documenting Provider Informant Last Dose Status  acetaminophen (TYLENOL) 500 MG tablet [id number] Yes Take 2 tablets (1,000 mg total) by mouth every 8  (eight) hours.  Patient taking differently: Take 1,000 mg by mouth every 8 (eight) hours as needed for moderate pain.   Ainsley Spinner, PA-C  11/02/2020 Unknown time Active  busPIRone (BUSPAR) 10 MG tablet [id number] Yes Take 10 mg by mouth 2 (two) times daily. [provider] Self 11/03/2020 0430 Active  Camphor-Menthol-Methyl Sal (SALONPAS) 3.06-24-08 % PTCH [id number] Yes Apply 1 patch topically daily as needed (pain). [provider] Self 11/02/2020 Unknown time Active  Cholecalciferol (VITAMIN D3) 125 MCG (5000 UT) CAPS [id number] Yes Take 5,000 Units by mouth daily. [provider] Self 11/02/2020 Unknown time Active  escitalopram (LEXAPRO) 10 MG tablet [id number] Yes Take 10 mg by mouth in the morning and at bedtime. [provider] Self 11/03/2020 0430 Active  ferrous sulfate 325 (65 FE) MG tablet [id number] Yes Take 325 mg by mouth daily.  [provider] Self 11/02/2020 Unknown time Active  gabapentin (NEURONTIN) 100 MG capsule [id number] Yes Take 100 mg by mouth daily as needed (pain). [provider] Self 11/03/2020 0430 Active  ibuprofen (ADVIL) 200 MG tablet [id number] Yes Take 800 mg by mouth every 8 (eight) hours as needed for moderate pain. [provider] Self 11/02/2020 Unknown time Active  omeprazole (PRILOSEC) 20 MG capsule [id number] Yes Take 20 mg by mouth daily.  [provider] Self 11/03/2020 0430 Active  oxybutynin (DITROPAN-XL) 5 MG 24 hr tablet [id number] Yes Take 5 mg by mouth daily. [provider] Self 11/03/2020 0430 Active  rOPINIRole (REQUIP) 1 MG tablet [id number] Yes Take 1 mg by mouth in the morning and at bedtime. [provider] Self 11/03/2020 0430 Active  rosuvastatin (CRESTOR) 10 MG tablet [id number] Yes Take 10 mg by mouth daily. [provider] Self 11/02/2020 Unknown time Active  SOLIQUA 100-33 UNT-MCG/ML SOPN [id number] Yes Inject 30 Units into the skin in the morning and at bedtime.  [provider] Self 11/02/2020 Unknown time Active  traZODone (DESYREL) 50 MG tablet [id number] Yes Take 50 mg by mouth at bedtime as needed for sleep. [provider] Self 11/02/2020 Unknown time Active  vitamin B-12 (CYANOCOBALAMIN) 1000 MCG tablet [id number] Yes Take 1,000 mcg by mouth daily. [provider] Self 11/02/2020 Unknown time Active     Physical Exam GENERAL: Alert, NAD. DRESSING: Clean, dry and intact. INTEGUMENT: Skin of both feet is warm and dry. Hemorrhagic hyperkeratotic lesion is present on the left sub 1st met head plantarly] Upon debridement there is no ulcer noted today. The ulcer has healed. There is still pain at the left foot sub 1st met head. Pedal hair growth is present bilaterally. Nails are well trimmed bilaterally. VASCULAR: DP is palpable bilaterally. PT is palpable bilaterally. No edema is present. MUSCULOSKELETAL: Muscle tone is normal bilaterally. Muscle strength is 5 out of 5  with regards to dorsiflexion, plantarflexion, inversion and eversion of both feet. Contracted left hallux IPJ and MPJ noted with prominent bone felt under the sub 1st met head. NEUROLOGIC: Light touch sensation is grossly intact bilaterally.  Assessment and Plan 1. Osteoarthritis of joint of toe of left foot M19.072 (715.97): 2. Hallux hammertoe M20.32 (735.3): 3. Pain in toe of left foot M79.675 (729.5): 4. Skin ulcer of left great toe with fat layer exposed L97.522 (707.15):  Plan The clinical findings, radiographic findings, assessment and plan were explained. I discussed the surgery in Layman's term. All the risk and benefits were explained to the patient including delayed union, nonunion, recurrence, swelling, infection, painful hardware, necrosis of toe, neuritis. All her questions were answered. No guarantees were given to patient for the outcome of the procedure. Informed consent was signed and witnessed. Post op pain medication and post op  protocol was explained to patient. Post op appointment was given to patient.

## 2020-11-04 ENCOUNTER — Encounter (HOSPITAL_COMMUNITY): Payer: Self-pay | Admitting: Podiatry

## 2020-11-05 LAB — SURGICAL PATHOLOGY

## 2020-11-24 ENCOUNTER — Emergency Department (HOSPITAL_COMMUNITY)
Admission: EM | Admit: 2020-11-24 | Discharge: 2020-11-24 | Disposition: A | Discharge status: Home / Self Care | Payer: Medicare HMO | Attending: Emergency Medicine | Admitting: Emergency Medicine

## 2020-11-24 ENCOUNTER — Encounter (HOSPITAL_COMMUNITY): Payer: Self-pay | Admitting: *Deleted

## 2020-11-24 ENCOUNTER — Other Ambulatory Visit: Payer: Self-pay

## 2020-11-24 DIAGNOSIS — M79675 Pain in left toe(s): Secondary | ICD-10-CM | POA: Diagnosis not present

## 2020-11-24 DIAGNOSIS — G8918 Other acute postprocedural pain: Secondary | ICD-10-CM | POA: Diagnosis not present

## 2020-11-24 DIAGNOSIS — Z4801 Encounter for change or removal of surgical wound dressing: Secondary | ICD-10-CM | POA: Diagnosis not present

## 2020-11-24 DIAGNOSIS — E119 Type 2 diabetes mellitus without complications: Secondary | ICD-10-CM | POA: Insufficient documentation

## 2020-11-24 DIAGNOSIS — J45909 Unspecified asthma, uncomplicated: Secondary | ICD-10-CM | POA: Insufficient documentation

## 2020-11-24 DIAGNOSIS — Z5189 Encounter for other specified aftercare: Secondary | ICD-10-CM

## 2020-11-24 DIAGNOSIS — Z853 Personal history of malignant neoplasm of breast: Secondary | ICD-10-CM | POA: Diagnosis not present

## 2020-11-24 LAB — CBC WITH DIFFERENTIAL/PLATELET
Abs Immature Granulocytes: 0.03 10*3/uL (ref 0.00–0.07)
Basophils Absolute: 0 10*3/uL (ref 0.0–0.1)
Basophils Relative: 0 %
Eosinophils Absolute: 0.2 10*3/uL (ref 0.0–0.5)
Eosinophils Relative: 2 %
HCT: 37.1 % (ref 36.0–46.0)
Hemoglobin: 12.5 g/dL (ref 12.0–15.0)
Immature Granulocytes: 0 %
Lymphocytes Relative: 18 %
Lymphs Abs: 1.6 10*3/uL (ref 0.7–4.0)
MCH: 32.1 pg (ref 26.0–34.0)
MCHC: 33.7 g/dL (ref 30.0–36.0)
MCV: 95.1 fL (ref 80.0–100.0)
Monocytes Absolute: 0.7 10*3/uL (ref 0.1–1.0)
Monocytes Relative: 8 %
Neutro Abs: 6.4 10*3/uL (ref 1.7–7.7)
Neutrophils Relative %: 72 %
Platelets: 298 10*3/uL (ref 150–400)
RBC: 3.9 MIL/uL (ref 3.87–5.11)
RDW: 12.3 % (ref 11.5–15.5)
WBC: 8.9 10*3/uL (ref 4.0–10.5)
nRBC: 0 % (ref 0.0–0.2)

## 2020-11-24 LAB — CBG MONITORING, ED: Glucose-Capillary: 142 mg/dL — ABNORMAL HIGH (ref 70–99)

## 2020-11-24 MED ORDER — BACITRACIN ZINC 500 UNIT/GM EX OINT
TOPICAL_OINTMENT | Freq: Once | CUTANEOUS | Status: AC
  Filled 2020-11-24: qty 0.9

## 2020-11-24 NOTE — Consult Note (Signed)
S: Patient was seen in the emergency room this morning. Patient paged one of my assistant last night around 11:45pm and said her foot was red and the redness was going up the foot. My assistant advised her to go to emergency room right away. Patient came this morning to emergency room. She denies any nausea, vomiting, fever or chills. She states the foot looks better now. She has been taking Bactrim DS. She had surgery done on May,18,2022 on the left foot by me. She has been on three different antibiotics since then.   Physical exam: Left foot: There is wound dehiscence noted on the dorsal aspect of the left hallux MPJ. The wound base is granular. Serous drainage noted. No odor noted. Does not probe to bone. No streaking redness noted. No increase in warmth noted. No crepitus noted.  Pedal pulses are palpable.   A: Wound dehiscence left foot S/p bunion surgery and fusion of the IPJ of the left hallux. (11/03/20) Uncontrolled diabetes mellitus- Last A1C 8.5  Cellulitis of the left foot.    P: Patient examined and evaluated. Brother is present at the bedside.  I recommend to continue the course of the Bactrim. I assured her that its stable, the redness has came down. I strongly recommend she stays off her foot. She states she needs to constantly walk and she uses cam walker to walk with weight on the heel.  I recommend elevating the foot. Continue daily dressing changes with Santyl and dry dressing. Patient is stable to discharge from my point of view.  Patient has appointment with me on 11/26/20 at 4pm.

## 2020-11-24 NOTE — ED Provider Notes (Signed)
[facility] Provider Note   CSN: [id number] Arrival date & time: 11/24/20  4132     History Chief Complaint  Patient presents with  . Post-op Problem    Yvette Mcconnell is a 70 y.o. female.  HPI   This patient is a very pleasant 70 year old female, she has a history of diabetes, she takes medication daily for this, she has a history of breast cancer status postmastectomy on the left.  She also has a history of diabetic neuropathy of her feet and underwent a surgical procedure to her phalangeal joint fusion of her left great toe on 18 May.  She was placed on sulfamethoxazole antibiotic and has had some persistent small amount of redness on that area for the last couple of weeks, she does not have any fevers or chills and has no other systemic symptoms, she does report that there has been some increasing drainage from the bandages of that foot, she was concerned that there may be an infection and when she called her surgeon Dr. Posey Pronto he recommended that she come to the hospital this morning and he would see her in the emergency department.  The patient denies any other complaints at this time.  She states that it actually looks better this morning than it did last night  Past Medical History:  Diagnosis Date  . Anemia   . Arthritis   . Asthma    as a child, no problems as an adult, no inhalers  . Ataxia, unspecified    cerebellum  . Cancer ([place]) 2000   Breast Cancer  . Depression   . Diabetes mellitus without complication ([place])    type 2  . GERD (gastroesophageal reflux disease)   . Hyperlipidemia   . Pneumonia    x 1 years ago  . Vitamin D deficiency 04/09/2019    Patient Active Problem List   Diagnosis Date Noted  . Vitamin D deficiency 04/09/2019  . Hyperlipidemia   . Diabetes mellitus without complication ([place])   . GERD (gastroesophageal reflux disease)   . Cancer ([place])   . 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 ABDOMINAL HYSTERECTOMY    . APPENDECTOMY    . BONE BIOPSY Left 03/13/2018   Procedure: BONE BIOPSY 1ST METATARSAL HEAD LEFT FOOT;  Surgeon: Tyson Babinski, DPM;  Location: [facility];  Service: Podiatry;  Laterality: Left;  . BUNIONECTOMY Left 11/03/2020   Procedure: Jake Michaelis BUNIONECTOMY OF LEFT HALLUX;  Surgeon: Tyson Babinski, DPM;  Location: [facility];  Service: Podiatry;  Laterality: Left;  . COLONOSCOPY    . DISTAL INTERPHALANGEAL JOINT FUSION Left 11/03/2020   Procedure: INTERPHALANGEAL JOINT FUSION OF LEFT HALLUX;  Surgeon: Tyson Babinski, DPM;  Location: [facility];  Service: Podiatry;  Laterality: Left;  . FOOT SURGERY    . INCISION AND DRAINAGE Left 03/13/2018   Procedure: INCISION AND DRAINAGE LEFT FOOT ULCER;  Surgeon: Tyson Babinski, DPM;  Location: [facility];  Service: Podiatry;  Laterality: Left;  Marland Kitchen MASTECTOMY Left   . ORIF HUMERUS FRACTURE Right 04/08/2019   Procedure: OPEN REDUCTION INTERNAL FIXATION  HUMERAL SHAFT NONUNION;  Surgeon: Altamese West Lawn, MD;  Location: [facility];  Service: Orthopedics;  Laterality: Right;  . rotator cuff repair Left   . SESAMOIDECTOMY Left 03/13/2018   Procedure: TIBIAL AND FIBULAR SESAMOIDECTOMY LEFT FOOT;  Surgeon: Tyson Babinski, DPM;  Location: [facility];  Service: Podiatry;  Laterality: Left;  . TONSILLECTOMY    .  UPPER GI ENDOSCOPY       OB History    Gravida      Para      Term      Preterm      AB      Living  0     SAB      IAB      Ectopic      Multiple      Live Births              Family History  Problem Relation Age of Onset  . Stroke Mother   . Diabetes Father     Social History   Tobacco Use  . Smoking status: Never Smoker  . Smokeless tobacco: Never Used  Vaping Use  . Vaping Use: Never used  Substance Use Topics  . Alcohol use: Never  . Drug use: Never    Home Medications Prior to Admission medications   Medication Sig Start Date End  Date Taking? Authorizing Provider  acetaminophen (TYLENOL) 500 MG tablet Take 2 tablets (1,000 mg total) by mouth every 8 (eight) hours. Patient taking differently: Take 1,000 mg by mouth every 8 (eight) hours as needed for moderate pain. 04/09/19  Yes Ainsley Spinner, PA-C  busPIRone (BUSPAR) 10 MG tablet Take 10 mg by mouth 2 (two) times daily.   Yes [provider]  Camphor-Menthol-Methyl Sal (SALONPAS) 3.06-24-08 % PTCH Apply 1 patch topically daily as needed (pain).   Yes [provider]  Cholecalciferol (VITAMIN D3) 125 MCG (5000 UT) CAPS Take 5,000 Units by mouth daily.   Yes [provider]  escitalopram (LEXAPRO) 10 MG tablet Take 10 mg by mouth in the morning and at bedtime.   Yes [provider]  ferrous sulfate 325 (65 FE) MG tablet Take 325 mg by mouth daily.    Yes [provider]  gabapentin (NEURONTIN) 100 MG capsule Take 100 mg by mouth daily as needed (pain).   Yes [provider]  ibuprofen (ADVIL) 200 MG tablet Take 800 mg by mouth every 8 (eight) hours as needed for moderate pain.   Yes [provider]  omeprazole (PRILOSEC) 20 MG capsule Take 20 mg by mouth daily.    Yes [provider]  oxybutynin (DITROPAN-XL) 5 MG 24 hr tablet Take 5 mg by mouth daily.   Yes [provider]  rOPINIRole (REQUIP) 1 MG tablet Take 1 mg by mouth in the morning and at bedtime.   Yes [provider]  rosuvastatin (CRESTOR) 10 MG tablet Take 10 mg by mouth daily.   Yes [provider]  SOLIQUA 100-33 UNT-MCG/ML SOPN Inject 30 Units into the skin in the morning and at bedtime. 08/13/20  Yes [provider]  sulfamethoxazole-trimethoprim (BACTRIM DS) 800-160 MG tablet Take 1 tablet by mouth 2 (two) times daily. 11/19/20  Yes [provider]  traZODone (DESYREL) 50 MG tablet Take 50 mg by mouth at bedtime as needed for sleep.   Yes [provider]  vitamin B-12 (CYANOCOBALAMIN) 1000 MCG  tablet Take 1,000 mcg by mouth daily.   Yes [provider]    Allergies    Morphine and related, Oxycodone, and Amoxicillin  Review of Systems   Review of Systems  Constitutional: Negative for chills and fever.  HENT: Negative for sore throat.   Eyes: Negative for visual disturbance.  Respiratory: Negative for cough and shortness of breath.   Cardiovascular: Negative for chest pain.  Gastrointestinal: Negative for abdominal pain,  diarrhea, nausea and vomiting.  Genitourinary: Negative for dysuria and frequency.  Musculoskeletal: Negative for back pain and neck pain.  Skin: Positive for rash and wound.  Neurological: Negative for weakness, numbness and headaches.  Hematological: Negative for adenopathy.  Psychiatric/Behavioral: Negative for behavioral problems.    Physical Exam Updated Vital Signs BP 122/80   Pulse 70   Temp 99.2 F (37.3 C) (Oral)   Resp 16   Ht 1.626 m (5\' 4" )   Wt 71.2 kg   LMP  (LMP Unknown)   SpO2 98%   BMI 26.94 kg/m   Physical Exam Vitals and nursing note reviewed.  Constitutional:      General: She is not in acute distress.    Appearance: She is well-developed.  HENT:     Head: Normocephalic and atraumatic.     Mouth/Throat:     Pharynx: No oropharyngeal exudate.  Eyes:     General: No scleral icterus.       Right eye: No discharge.        Left eye: No discharge.     Conjunctiva/sclera: Conjunctivae normal.     Pupils: Pupils are equal, round, and reactive to light.  Neck:     Thyroid: No thyromegaly.     Vascular: No JVD.  Cardiovascular:     Rate and Rhythm: Normal rate and regular rhythm.     Heart sounds: Normal heart sounds. No murmur heard. No friction rub. No gallop.   Pulmonary:     Effort: Pulmonary effort is normal. No respiratory distress.     Breath sounds: Normal breath sounds. No wheezing or rales.  Musculoskeletal:        General: No tenderness. Normal range of motion.     Cervical back: Normal range of  motion and neck supple.  Lymphadenopathy:     Cervical: No cervical adenopathy.  Skin:    General: Skin is warm and dry.     Findings: No erythema or rash.     Comments: There is some skin around the left great toe which has what appears to be some fibrinous exudate, there is no foul smell, there is mild redness of the left foot at the base of the toe extending onto the dorsum of the foot  Neurological:     General: No focal deficit present.     Mental Status: She is alert.     Coordination: Coordination normal.     Comments: The patient has decree sensation to the bilateral feet.  She is able to move all 4 extremities with normal strength and has cranial nerves III through XII which are normal  Psychiatric:        Behavior: Behavior normal.     ED Results / Procedures / Treatments   Labs (all labs ordered are listed, but only abnormal results are displayed) Labs Reviewed  CBG MONITORING, ED - Abnormal; Notable for the following components:      Result Value   Glucose-Capillary 142 (*)    All other components within normal limits  CBC WITH DIFFERENTIAL/PLATELET    EKG None  Radiology No results found.  Procedures Procedures   Medications Ordered in ED Medications  bacitracin ointment (has no administration in time range)    ED Course  I have reviewed the triage vital signs and the nursing notes.  Pertinent labs & imaging results that were available during my care of the patient were reviewed by me and considered in my medical decision making (see chart for details).  Clinical Course as of 11/24/20 0958  Wed Nov 24, 2020  0945 Dr. Posey Pronto has seen in ED -0 requests CBC - anticipate d/c and f/u on Friday in office at her appt. [BM]  0957 Cbc normal  [BM]    Clinical Course User Index [BM] Noemi Chapel, MD   MDM Rules/Calculators/A&P                          Podiatry to see Patient does not appear septic Glucose 142 Patient otherwise well-appearing  CBC  normal Home with supportive care  Final Clinical Impression(s) / ED Diagnoses Final diagnoses:  Visit for wound check  Post-operative pain    Rx / DC Orders ED Discharge Orders    None       Noemi Chapel, MD 11/24/20 [phone]

## 2020-11-24 NOTE — Discharge Instructions (Signed)
Please finish your antibiotics and follow up with Dr. Posey Pronto in the office on Friday.  Your blood work was normal!  ER for severe pain / redness spreading, swelling / fever or worsening symptoms.

## 2020-11-24 NOTE — ED Triage Notes (Signed)
Pt c/o possible left great toe post op infection. Pt had her left great toe broken and pin placed on 11/03/20. Pt reports she has been having redness that started on her toe 2 weeks ago and it has continued to spread down her foot. Pt has been on Sulfa after her surgery.

## 2022-11-17 IMAGING — DX DG FOOT COMPLETE 3+V*L*
3 series · 3 of 3 positions shown · non-contrast
Comparison: Radiograph 03/13/2018

CLINICAL DATA: Preop left foot

EXAM:
LEFT FOOT - COMPLETE 3+ VIEW

[foot ap]
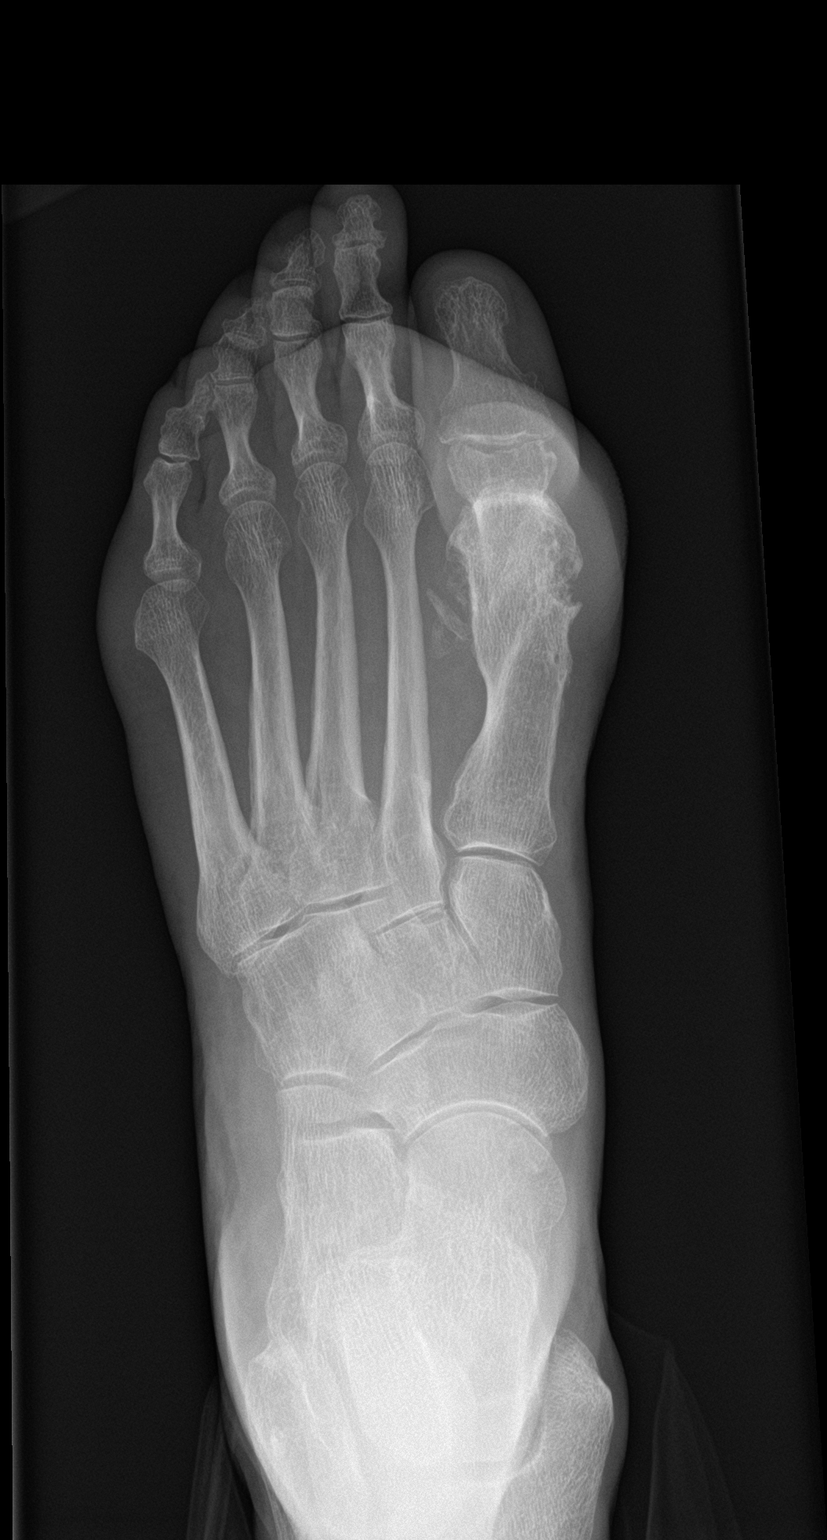

[foot obl]
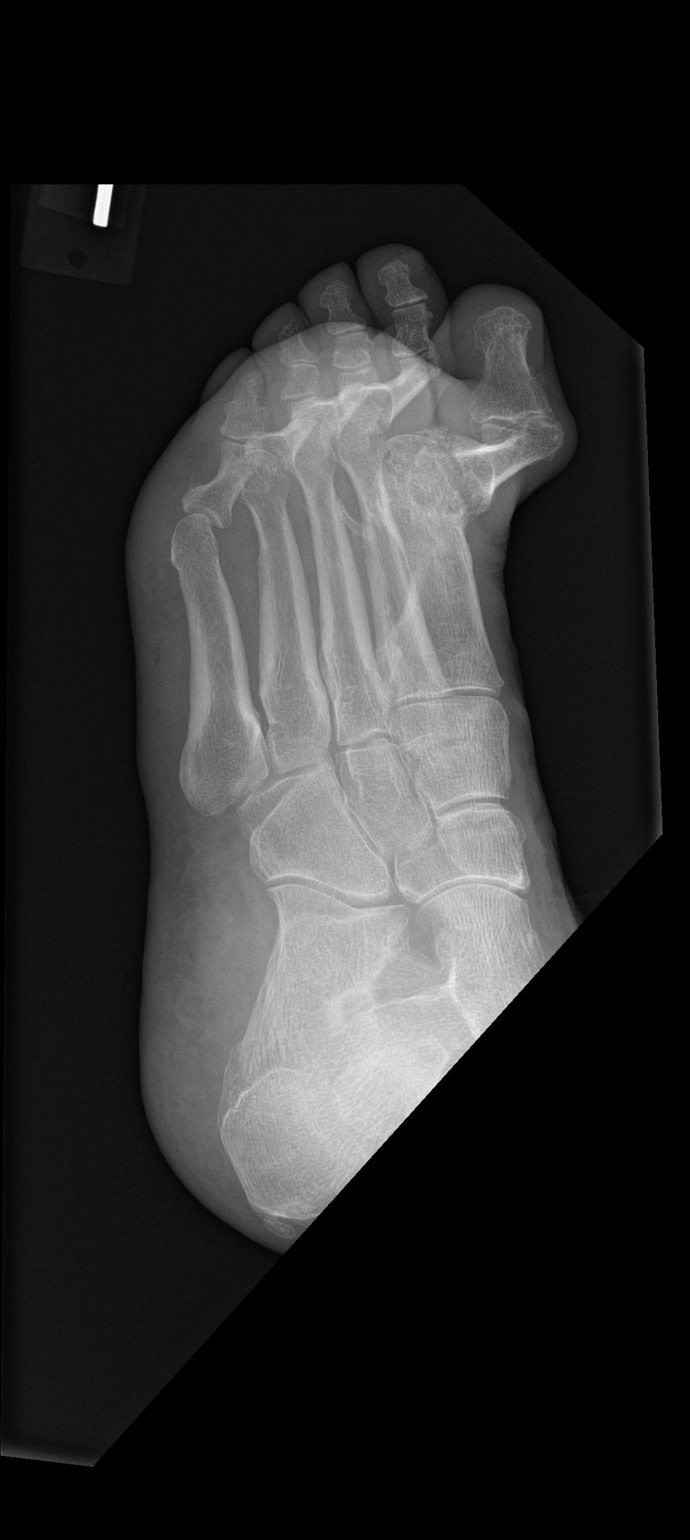

[foot lat]
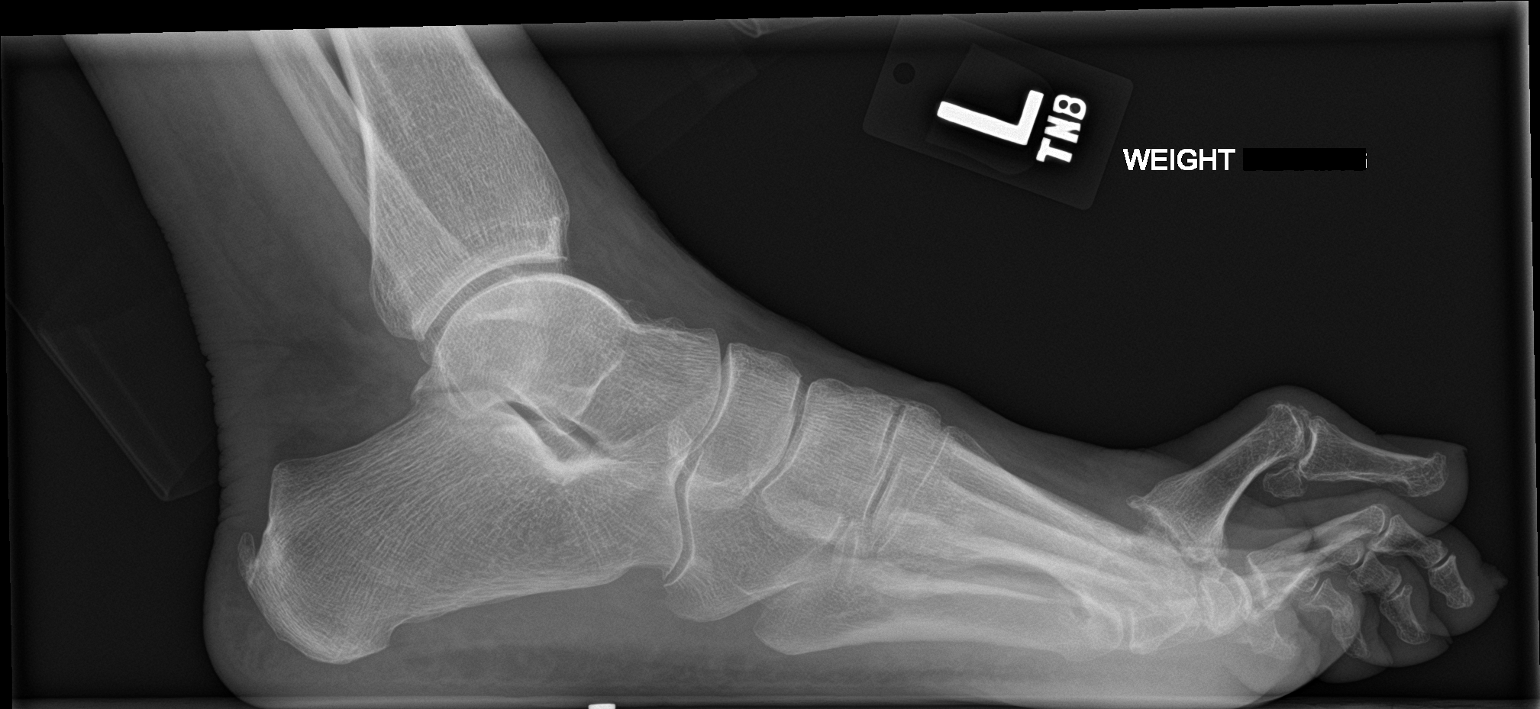

[3 of 3 positions shown; findings below may reference images not displayed]

FINDINGS: There is severe degenerative change of the first metatarsophalangeal
joint with extension at the first MTP and flexion at the first IP
joint. There is just a cortical erosion along the medial aspect of
the distal first metatarsal. There is heterotopic ossification
formation.
IMPRESSION: Severe degenerative change of the first MTP joint with exaggerated
extension at the first MTP and flexion of the first IP joint.
Moderate moderate great toe IP joint degenerative arthritis.

## 2022-11-19 IMAGING — RF DG FOOT 2V*L*
1 series · 3 of 3 positions shown · non-contrast
Comparison: Radiographs 11/01/2020.

CLINICAL DATA: Left foot bunionectomy with pin placement.

EXAM:
DG C-ARM 1-60 MIN; LEFT FOOT - 2 VIEW

[Series 1: run · 3 of 3 slices shown]
[im 1/3]
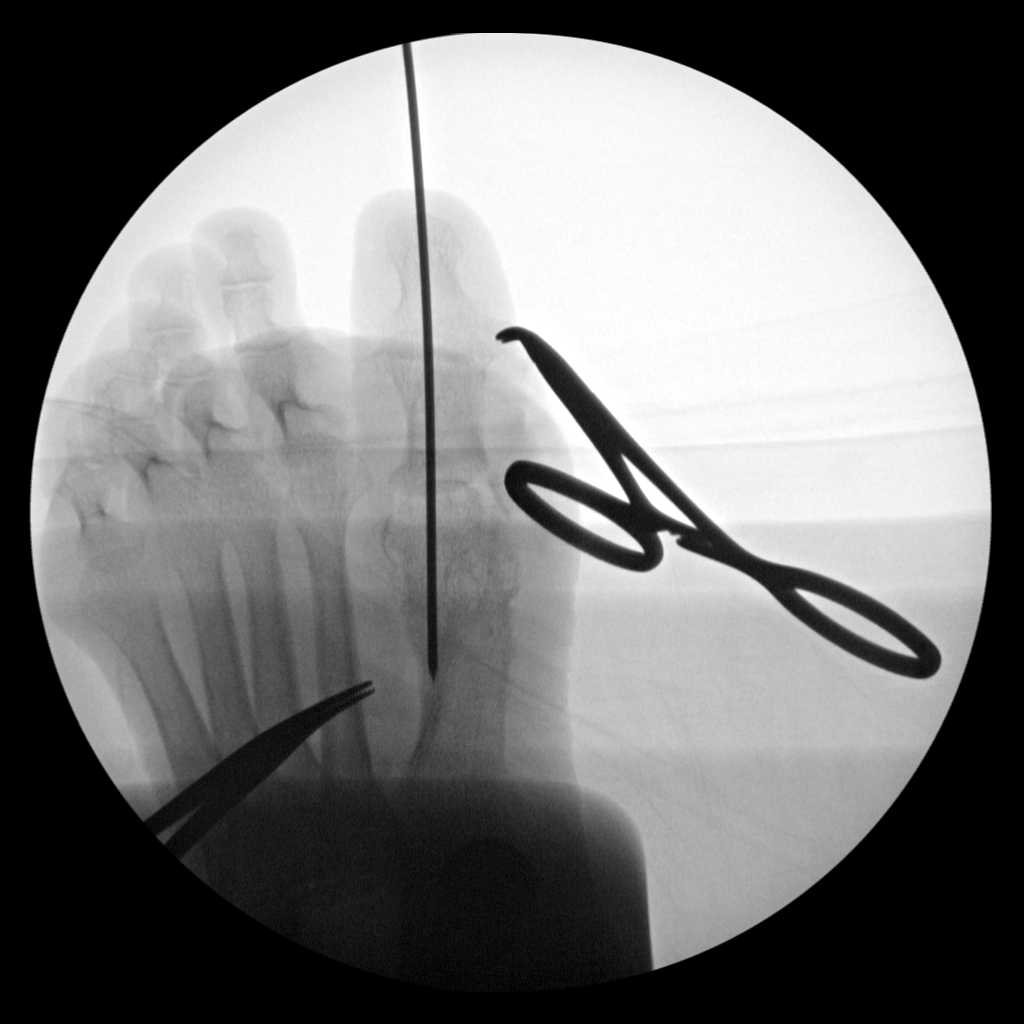
[im 2/3]
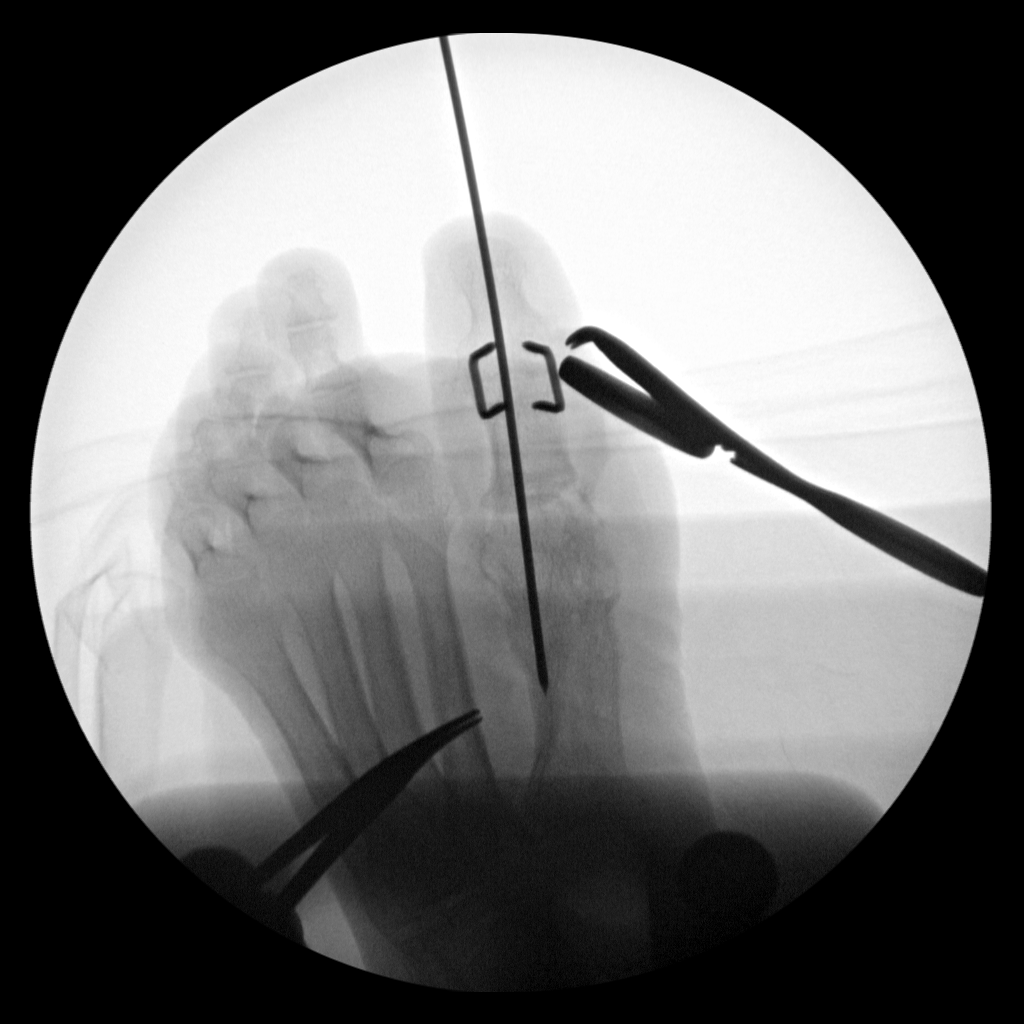
[im 3/3]
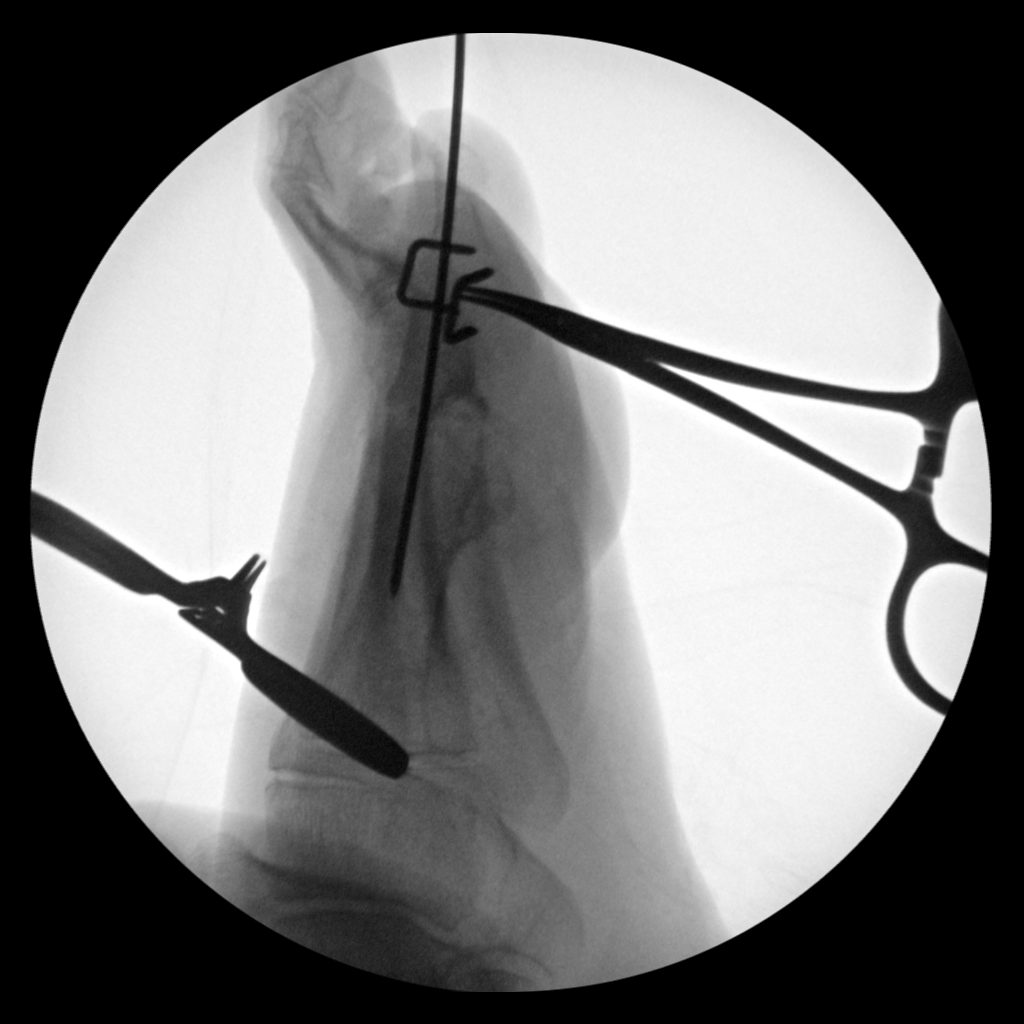

[3 of 3 positions shown; findings below may reference images not displayed]

FINDINGS: C-arm fluoroscopy was provided in the operating [HOSPITAL] seconds of
fluoroscopy time.

Three spot fluoroscopic images of the left forefoot are submitted
postoperatively. These demonstrate the placement of a K-wire across
the phalanges of the great toe and distal 1st metatarsal. Interval
straightening of the flexion deformity at the interphalangeal joint
of the great toe with placement of 2 dorsal metallic staples. No
demonstrated complications.
IMPRESSION: Intraoperative views as described.  No demonstrated complications.

## 2023-06-01 ENCOUNTER — Other Ambulatory Visit: Payer: Self-pay

## 2023-06-01 ENCOUNTER — Emergency Department (HOSPITAL_COMMUNITY)
Admission: EM | Admit: 2023-06-01 | Discharge: 2023-06-02 | Disposition: A | Discharge status: Home / Self Care | Payer: Medicare HMO | Attending: Emergency Medicine | Admitting: Emergency Medicine

## 2023-06-01 ENCOUNTER — Encounter (HOSPITAL_COMMUNITY): Payer: Self-pay

## 2023-06-01 DIAGNOSIS — Z79899 Other long term (current) drug therapy: Secondary | ICD-10-CM | POA: Insufficient documentation

## 2023-06-01 DIAGNOSIS — I10 Essential (primary) hypertension: Secondary | ICD-10-CM | POA: Insufficient documentation

## 2023-06-01 DIAGNOSIS — E119 Type 2 diabetes mellitus without complications: Secondary | ICD-10-CM | POA: Diagnosis not present

## 2023-06-01 DIAGNOSIS — I160 Hypertensive urgency: Secondary | ICD-10-CM

## 2023-06-01 NOTE — ED Triage Notes (Signed)
Pt stated that she checked her BP around 2109 and its was 200s/100s. Pt stated that she went to [facility] ER earlier today for th same issue. Pt does not take BP medication

## 2023-06-02 LAB — CBC WITH DIFFERENTIAL/PLATELET
Abs Immature Granulocytes: 0.01 10*3/uL (ref 0.00–0.07)
Basophils Absolute: 0 10*3/uL (ref 0.0–0.1)
Basophils Relative: 0 %
Eosinophils Absolute: 0.3 10*3/uL (ref 0.0–0.5)
Eosinophils Relative: 4 %
HCT: 36 % (ref 36.0–46.0)
Hemoglobin: 11.9 g/dL — ABNORMAL LOW (ref 12.0–15.0)
Immature Granulocytes: 0 %
Lymphocytes Relative: 21 %
Lymphs Abs: 1.4 10*3/uL (ref 0.7–4.0)
MCH: 31.2 pg (ref 26.0–34.0)
MCHC: 33.1 g/dL (ref 30.0–36.0)
MCV: 94.5 fL (ref 80.0–100.0)
Monocytes Absolute: 0.6 10*3/uL (ref 0.1–1.0)
Monocytes Relative: 9 %
Neutro Abs: 4.6 10*3/uL (ref 1.7–7.7)
Neutrophils Relative %: 66 %
Platelets: 246 10*3/uL (ref 150–400)
RBC: 3.81 MIL/uL — ABNORMAL LOW (ref 3.87–5.11)
RDW: 12.4 % (ref 11.5–15.5)
WBC: 7 10*3/uL (ref 4.0–10.5)
nRBC: 0 % (ref 0.0–0.2)

## 2023-06-02 LAB — BASIC METABOLIC PANEL
Anion gap: 8 (ref 5–15)
BUN: 27 mg/dL — ABNORMAL HIGH (ref 8–23)
CO2: 26 mmol/L (ref 22–32)
Calcium: 9.2 mg/dL (ref 8.9–10.3)
Chloride: 105 mmol/L (ref 98–111)
Creatinine, Ser: 0.93 mg/dL (ref 0.44–1.00)
GFR, Estimated: >60 mL/min (ref >60)
Glucose, Bld: 81 mg/dL (ref 70–99)
Potassium: 3.9 mmol/L (ref 3.5–5.1)
Sodium: 139 mmol/L (ref 135–145)

## 2023-06-02 MED ORDER — CLONIDINE HCL 0.1 MG PO TABS: 0.1000 mg | ORAL_TABLET | Freq: Two times a day (BID) | ORAL | 0 refills | Status: AC | PRN

## 2023-06-02 MED ORDER — CLONIDINE HCL 0.1 MG PO TABS
0.1000 mg | ORAL_TABLET | Freq: Once | ORAL | Status: AC
  Administered 2023-06-02: 0.1 mg via ORAL
  Filled 2023-06-02: qty 1

## 2023-06-02 MED ORDER — AMLODIPINE BESYLATE 5 MG PO TABS: 5.0000 mg | ORAL_TABLET | Freq: Every day | ORAL | 0 refills | Status: AC

## 2023-06-02 NOTE — ED Provider Notes (Signed)
[place] EMERGENCY DEPARTMENT AT [facility] Provider Note   CSN: [id number] Arrival date & time: 06/01/23  2330     History  Chief Complaint  Patient presents with   Hypertension    Yvette Mcconnell is a 72 y.o. female.  Patient is a 72 year old female with a past medical history of diabetes, hyperlipidemia, GERD.  Patient presenting today for evaluation of elevated blood pressure.  She checked her blood pressure earlier today and it was over 200 systolic.  She was seen at [facility] emergency department earlier today where she had a CT scan of her chest and laboratory studies.  All of these studies were unremarkable and she was discharged to home.  Since returning home, her blood pressures have continued in the 200s systolic range and this is very concerning to her.  Patient otherwise has no complaints.  She denies any chest pain or difficulty breathing.  No headaches or visual disturbances.  The history is provided by the patient.       Home Medications Prior to Admission medications   Medication Sig Start Date End Date Taking? Authorizing Provider  acetaminophen (TYLENOL) 500 MG tablet Take 2 tablets (1,000 mg total) by mouth every 8 (eight) hours. Patient taking differently: Take 1,000 mg by mouth every 8 (eight) hours as needed for moderate pain. 04/09/19   Montez Morita, PA-C  busPIRone (BUSPAR) 10 MG tablet Take 10 mg by mouth 2 (two) times daily.    [provider]  Camphor-Menthol-Methyl Sal (SALONPAS) 3.06-24-08 % PTCH Apply 1 patch topically daily as needed (pain).    [provider]  Cholecalciferol (VITAMIN D3) 125 MCG (5000 UT) CAPS Take 5,000 Units by mouth daily.    [provider]  escitalopram (LEXAPRO) 10 MG tablet Take 10 mg by mouth in the morning and at bedtime.    [provider]  ferrous sulfate 325 (65 FE) MG tablet Take 325 mg by mouth daily.     [provider]  gabapentin (NEURONTIN) 100 MG capsule Take  100 mg by mouth daily as needed (pain).    [provider]  ibuprofen (ADVIL) 200 MG tablet Take 800 mg by mouth every 8 (eight) hours as needed for moderate pain.    [provider]  omeprazole (PRILOSEC) 20 MG capsule Take 20 mg by mouth daily.     [provider]  oxybutynin (DITROPAN-XL) 5 MG 24 hr tablet Take 5 mg by mouth daily.    [provider]  rOPINIRole (REQUIP) 1 MG tablet Take 1 mg by mouth in the morning and at bedtime.    [provider]  rosuvastatin (CRESTOR) 10 MG tablet Take 10 mg by mouth daily.    [provider]  SOLIQUA 100-33 UNT-MCG/ML SOPN Inject 30 Units into the skin in the morning and at bedtime. 08/13/20   [provider]  sulfamethoxazole-trimethoprim (BACTRIM DS) 800-160 MG tablet Take 1 tablet by mouth 2 (two) times daily. 11/19/20   [provider]  traZODone (DESYREL) 50 MG tablet Take 50 mg by mouth at bedtime as needed for sleep.    [provider]  vitamin B-12 (CYANOCOBALAMIN) 1000 MCG tablet Take 1,000 mcg by mouth daily.    [provider]      Allergies    Morphine and codeine, Oxycodone, and Amoxicillin    Review of Systems   Review of Systems  All other systems reviewed and are negative.   Physical Exam Updated Vital Signs BP Marland Kitchen)  190/106   Pulse 72   Temp 98.3 F (36.8 C) (Oral)   Resp 18   Ht 5\' 4"  (1.626 m)   Wt 69.9 kg   LMP  (LMP Unknown)   SpO2 99%   BMI 26.43 kg/m  Physical Exam Vitals and nursing note reviewed.  Constitutional:      General: She is not in acute distress.    Appearance: She is well-developed. She is not diaphoretic.  HENT:     Head: Normocephalic and atraumatic.  Cardiovascular:     Rate and Rhythm: Normal rate and regular rhythm.     Heart sounds: No murmur heard.    No friction rub. No gallop.  Pulmonary:     Effort: Pulmonary effort is normal. No respiratory distress.     Breath sounds: Normal breath sounds. No  wheezing.  Abdominal:     General: Bowel sounds are normal. There is no distension.     Palpations: Abdomen is soft.     Tenderness: There is no abdominal tenderness.  Musculoskeletal:        General: Normal range of motion.     Cervical back: Normal range of motion and neck supple.  Skin:    General: Skin is warm and dry.  Neurological:     General: No focal deficit present.     Mental Status: She is alert and oriented to person, place, and time.     ED Results / Procedures / Treatments   Labs (all labs ordered are listed, but only abnormal results are displayed) Labs Reviewed  CBC WITH DIFFERENTIAL/PLATELET  BASIC METABOLIC PANEL    EKG EKG Interpretation Date/Time:  Friday June 01 2023 23:46:08 EST Ventricular Rate:  74 PR Interval:  154 QRS Duration:  91 QT Interval:  395 QTC Calculation: 439 R Axis:   88  Text Interpretation: Sinus arrhythmia Probable left atrial enlargement Consider right ventricular hypertrophy Minimal ST elevation, anterior leads No significant change since 04/08/2019 Confirmed by Geoffery Lyons ([id number]) on 06/02/2023 1:00:25 AM  Radiology No results found.  Procedures Procedures    Medications Ordered in ED Medications  cloNIDine (CATAPRES) tablet 0.1 mg (has no administration in time range)    ED Course/ Medical Decision Making/ A&P  Patient is a 72 year old female presenting with elevated blood pressure.  Patient arrives here with blood pressure of 190/106, but vital signs that are otherwise stable.  Physical exam is unremarkable and patient is neurologically intact.  Laboratory studies obtained including CBC and basic metabolic panel, both of which are unremarkable.  Patient has been given clonidine and blood pressure is now 116 systolic.  At this point, I feel as though patient can safely be discharged.  I will start her on amlodipine and also prescribe a small quantity of clonidine which she can take if her blood pressures are  refractory at home.  Final Clinical Impression(s) / ED Diagnoses Final diagnoses:  None    Rx / DC Orders ED Discharge Orders     None         Geoffery Lyons, MD 06/02/23 [phone]

## 2023-06-02 NOTE — Discharge Instructions (Addendum)
Begin taking amlodipine 5 mg daily.  Begin taking clonidine 0.1 mg every 12 hours as needed if your blood pressures remain in the 1 90-200 systolic range.  Keep a record of your blood pressures at home and follow-up with your doctor in the next week.
